# Patient Record
Sex: Female | Born: 1970 | Race: Black or African American | Hispanic: No | Marital: Married | Smoking: Never smoker
Health system: Southern US, Community
[De-identification: ages and names within clinical notes are randomized; demographics above are authoritative.]

## PROBLEM LIST (undated history)

## (undated) DIAGNOSIS — M545 Low back pain, unspecified: Secondary | ICD-10-CM

## (undated) DIAGNOSIS — G43909 Migraine, unspecified, not intractable, without status migrainosus: Secondary | ICD-10-CM

## (undated) DIAGNOSIS — I10 Essential (primary) hypertension: Secondary | ICD-10-CM

## (undated) DIAGNOSIS — R569 Unspecified convulsions: Secondary | ICD-10-CM

## (undated) DIAGNOSIS — Z789 Other specified health status: Secondary | ICD-10-CM

## (undated) DIAGNOSIS — F419 Anxiety disorder, unspecified: Secondary | ICD-10-CM

## (undated) HISTORY — DX: Essential (primary) hypertension: I10

## (undated) HISTORY — DX: Unspecified convulsions: R56.9

## (undated) HISTORY — DX: Low back pain, unspecified: M54.50

## (undated) HISTORY — DX: Other specified health status: Z78.9

## (undated) HISTORY — DX: Anxiety disorder, unspecified: F41.9

## (undated) HISTORY — PX: TUBAL LIGATION: SHX77

## (undated) HISTORY — DX: Migraine, unspecified, not intractable, without status migrainosus: G43.909

## (undated) HISTORY — DX: Low back pain: M54.5

---

## 1997-05-31 HISTORY — PX: BRAIN SURGERY: SHX531

## 2005-04-13 ENCOUNTER — Emergency Department: Payer: Self-pay | Admitting: Emergency Medicine

## 2005-05-16 ENCOUNTER — Emergency Department: Payer: Self-pay | Admitting: Emergency Medicine

## 2005-09-06 ENCOUNTER — Emergency Department: Payer: Self-pay | Admitting: Emergency Medicine

## 2006-09-06 ENCOUNTER — Emergency Department: Payer: Self-pay | Admitting: Emergency Medicine

## 2006-12-14 ENCOUNTER — Emergency Department: Payer: Self-pay | Admitting: Emergency Medicine

## 2007-03-15 ENCOUNTER — Emergency Department: Payer: Self-pay

## 2007-03-19 ENCOUNTER — Emergency Department: Payer: Self-pay | Admitting: Emergency Medicine

## 2007-09-02 ENCOUNTER — Other Ambulatory Visit: Payer: Self-pay

## 2007-09-02 ENCOUNTER — Inpatient Hospital Stay: Payer: Self-pay | Admitting: Internal Medicine

## 2007-09-25 ENCOUNTER — Encounter: Admission: RE | Admit: 2007-09-25 | Discharge: 2007-09-25 | Payer: Self-pay | Admitting: Neurosurgery

## 2007-09-29 ENCOUNTER — Encounter (INDEPENDENT_AMBULATORY_CARE_PROVIDER_SITE_OTHER): Payer: Self-pay | Admitting: Neurosurgery

## 2007-09-29 ENCOUNTER — Inpatient Hospital Stay (HOSPITAL_COMMUNITY): Admission: RE | Admit: 2007-09-29 | Discharge: 2007-10-01 | Payer: Self-pay | Admitting: Neurosurgery

## 2008-06-06 ENCOUNTER — Emergency Department: Payer: Self-pay | Admitting: Emergency Medicine

## 2008-06-11 ENCOUNTER — Observation Stay: Payer: Self-pay | Admitting: Internal Medicine

## 2008-11-05 ENCOUNTER — Emergency Department: Payer: Self-pay | Admitting: Emergency Medicine

## 2009-04-11 ENCOUNTER — Emergency Department: Payer: Self-pay | Admitting: Emergency Medicine

## 2009-06-04 ENCOUNTER — Emergency Department: Payer: Self-pay | Admitting: Emergency Medicine

## 2009-08-15 ENCOUNTER — Emergency Department: Payer: Self-pay | Admitting: Emergency Medicine

## 2009-08-18 ENCOUNTER — Emergency Department: Payer: Self-pay | Admitting: Emergency Medicine

## 2010-06-21 ENCOUNTER — Encounter: Payer: Self-pay | Admitting: Neurosurgery

## 2010-06-25 ENCOUNTER — Inpatient Hospital Stay: Payer: Self-pay | Admitting: Internal Medicine

## 2010-10-13 NOTE — Discharge Summary (Signed)
NAME:  Jaclyn Mcknight, Jaclyn Mcknight           ACCOUNT NO.:  [id number]   MEDICAL RECORD NO.:  [id number]          PATIENT TYPE:  INP   LOCATION:  3038                         FACILITY:  [facility]   PHYSICIAN:  Danae Orleans. Venetia Maxon, M.D.  DATE OF BIRTH:  06-23-1970   DATE OF ADMISSION:  09/29/2007  DATE OF DISCHARGE:  10/01/2007                               DISCHARGE SUMMARY   REASON FOR ADMISSION:  Examining right temperoparietal skull lesions.   FINAL DIAGNOSES:  Examining right temperoparietal skull lesions.   HISTORY OF PRESENT ILLNESS AND HOSPITAL COURSE:  Jaclyn Mcknight is a  40 year old woman with a lesion in the right temperoparietal skull.  She  has been complaining of headache.  A CT scan a year ago and now more  recently showed increasing size in the lesion.  Because of this, a  surgery is planned and the patient was admitted to hospital on the same-  day procedure basis and underwent resection of skull lesion with  methacrylate cranioplasty along with plates.  The patient tolerated the  procedure well and postoperatively did well but was having nausea and  vomiting on Sep 30, 2007, which resolved by Oct 01, 2007.  She was doing  better and was discharged home in stable surgical condition.   She was discharged on her preoperative medications of Ramipril,  Lopressor, aspirin, Xanax, and simvastatin with a prescription for  Percocet 5/325 one to two every 4 hours as needed for pain, dispensed  #50.  She is instructed to follow up with Dr. Jeral Fruit in his office.      Danae Orleans. Venetia Maxon, M.D.  Electronically Signed     JDS/MEDQ  D:  10/01/2007  T:  10/01/2007  Job:  [id number]

## 2010-10-13 NOTE — Op Note (Signed)
NAME:  Jaclyn Mcknight, Jaclyn Mcknight           ACCOUNT NO.:  [id number]   MEDICAL RECORD NO.:  [id number]          PATIENT TYPE:  INP   LOCATION:  3038                         FACILITY:  [facility]   PHYSICIAN:  Hilda Lias, M.D.   DATE OF BIRTH:  10/24/1970   DATE OF PROCEDURE:  DATE OF DISCHARGE:                               OPERATIVE REPORT   PREOPERATIVE DIAGNOSES:  Skull lesion in the right temporoparietal area,  expanding.   POSTOPERATIVE DIAGNOSIS:  Skull lesion in the right temporoparietal  area, expanding.   PROCEDURE:  Right temporoparietal craniectomy with resection of the bone  lesion.   SURGEON:  Hilda Lias, MD   ASSISTANT:  Danae Orleans. Venetia Maxon, MD   CLINICAL HISTORY:  Jaclyn Mcknight is a 40 year old female who has a lesion  in the right temporoparietal area.  She had been complaining of  headache.  She had a CT scan a year ago and now one recently, which  showed increase of the lesion.  Because of that, she wanted to proceed  with surgery secondary to the pain and of course because the lesion had  been expanding.  There has not been any other complaints.   PROCEDURE:  The patient was taken to the OR and the scalp was cleaned  with DuraPrep.  Prior to that, we had some skull x-rays with a needle in  place which showed the area where the lesion was.  From then on, drapes  were applied to the scalp and infiltration of the skin was done with  Xylocaine.  An italic S-type incision was made through the skin down to  the galea.  Resection was made laterally.  Immediately, it was apparent  the area where the lesion was.  We did a bur hole, and then with a  craniotome we went about 2 cm away from the edges of the lesion in a  simple fashion.  Once we accomplished the craniectomy, we were able to  see the edges left behind and the rest of the skull were normal.  The  area of the lesion were fairly dark.  The specimen was sent to the  laboratory.  We looked around just to be sure there  was no other lesion.  The edges were smoothed with a drill.  Then a tack-up suture with  Nurolon for dura matter to edge of the skull was found to prevent  epidural hematoma.  Then, methyl methacrylate was used to tailor a new  plate.  This plate was secured in place using 3 plates with six screws.  Irrigation was done and the hemostasis was accomplished, and the wound  was closed with Vicryl and Dermabond.           ______________________________  Hilda Lias, M.D.     EB/MEDQ  D:  09/29/2007  T:  09/30/2007  Job:  [id number]

## 2010-10-14 ENCOUNTER — Emergency Department: Payer: Self-pay | Admitting: Emergency Medicine

## 2010-11-19 ENCOUNTER — Emergency Department: Payer: Self-pay | Admitting: Emergency Medicine

## 2011-02-23 LAB — TYPE AND SCREEN
ABO/RH(D): B POS
Antibody Screen: NEGATIVE

## 2011-02-23 LAB — CBC
MCV: 92.8
RBC: 4.01
WBC: 7.3

## 2011-02-23 LAB — BASIC METABOLIC PANEL
BUN: 9
Creatinine, Ser: 0.74
GFR calc non Af Amer: >60
Glucose, Bld: 77
Potassium: 4

## 2011-02-23 LAB — ABO/RH: ABO/RH(D): B POS

## 2011-10-14 ENCOUNTER — Emergency Department: Payer: Self-pay | Admitting: Emergency Medicine

## 2012-04-06 LAB — HM PAP SMEAR: HM PAP: NEGATIVE

## 2012-04-14 ENCOUNTER — Emergency Department: Payer: Self-pay | Admitting: Emergency Medicine

## 2012-07-24 ENCOUNTER — Ambulatory Visit: Payer: Self-pay | Admitting: Physical Medicine and Rehabilitation

## 2012-10-10 ENCOUNTER — Emergency Department: Payer: Self-pay | Admitting: Emergency Medicine

## 2013-09-03 ENCOUNTER — Emergency Department: Payer: Self-pay | Admitting: Emergency Medicine

## 2013-11-29 ENCOUNTER — Emergency Department: Payer: Self-pay | Admitting: Emergency Medicine

## 2014-09-19 DIAGNOSIS — G43009 Migraine without aura, not intractable, without status migrainosus: Secondary | ICD-10-CM | POA: Insufficient documentation

## 2014-09-19 DIAGNOSIS — E162 Hypoglycemia, unspecified: Secondary | ICD-10-CM | POA: Insufficient documentation

## 2014-09-19 DIAGNOSIS — E782 Mixed hyperlipidemia: Secondary | ICD-10-CM | POA: Insufficient documentation

## 2014-09-19 DIAGNOSIS — I10 Essential (primary) hypertension: Secondary | ICD-10-CM | POA: Insufficient documentation

## 2014-09-19 DIAGNOSIS — R0789 Other chest pain: Secondary | ICD-10-CM | POA: Insufficient documentation

## 2014-09-19 DIAGNOSIS — G40909 Epilepsy, unspecified, not intractable, without status epilepticus: Secondary | ICD-10-CM | POA: Insufficient documentation

## 2014-09-26 ENCOUNTER — Emergency Department
Admit: 2014-09-26 | Disposition: A | Discharge status: Home / Self Care | Payer: Self-pay | Admitting: Emergency Medicine

## 2014-12-03 ENCOUNTER — Ambulatory Visit: Payer: Self-pay | Admitting: Family Medicine

## 2014-12-12 ENCOUNTER — Encounter: Payer: Self-pay | Admitting: Urgent Care

## 2014-12-12 ENCOUNTER — Emergency Department
Admission: EM | Admit: 2014-12-12 | Discharge: 2014-12-12 | Disposition: A | Discharge status: Home / Self Care | Payer: 59 | Attending: Emergency Medicine | Admitting: Emergency Medicine

## 2014-12-12 DIAGNOSIS — Z8669 Personal history of other diseases of the nervous system and sense organs: Secondary | ICD-10-CM | POA: Diagnosis not present

## 2014-12-12 DIAGNOSIS — Z79899 Other long term (current) drug therapy: Secondary | ICD-10-CM | POA: Diagnosis not present

## 2014-12-12 DIAGNOSIS — R519 Headache, unspecified: Secondary | ICD-10-CM

## 2014-12-12 DIAGNOSIS — R51 Headache: Secondary | ICD-10-CM | POA: Insufficient documentation

## 2014-12-12 DIAGNOSIS — I10 Essential (primary) hypertension: Secondary | ICD-10-CM | POA: Insufficient documentation

## 2014-12-12 MED ORDER — SODIUM CHLORIDE 0.9 % IV SOLN
1000.0000 mL | Freq: Once | INTRAVENOUS | Status: AC
  Administered 2014-12-12: 1000 mL via INTRAVENOUS

## 2014-12-12 MED ORDER — BUTALBITAL-APAP-CAFFEINE 50-325-40 MG PO TABS: 1.0000 | ORAL_TABLET | Freq: Four times a day (QID) | ORAL | Status: AC | PRN

## 2014-12-12 MED ORDER — KETOROLAC TROMETHAMINE 30 MG/ML IJ SOLN
30.0000 mg | Freq: Once | INTRAMUSCULAR | Status: AC
  Administered 2014-12-12: 30 mg via INTRAVENOUS
  Filled 2014-12-12: qty 1

## 2014-12-12 MED ORDER — DEXTROSE 5 % IV SOLN
20.0000 mg | Freq: Once | INTRAVENOUS | Status: DC
  Filled 2014-12-12: qty 4

## 2014-12-12 MED ORDER — DIPHENHYDRAMINE HCL 50 MG/ML IJ SOLN
25.0000 mg | Freq: Once | INTRAMUSCULAR | Status: AC
  Administered 2014-12-12: 25 mg via INTRAVENOUS
  Filled 2014-12-12: qty 1

## 2014-12-12 MED ORDER — SODIUM CHLORIDE 0.9 % IV SOLN
20.0000 mg | Freq: Once | INTRAVENOUS | Status: AC
  Administered 2014-12-12: 20 mg via INTRAVENOUS
  Filled 2014-12-12: qty 4

## 2014-12-12 NOTE — ED Notes (Signed)
Headache since yesterday.  No neuro deficits

## 2014-12-12 NOTE — Discharge Instructions (Signed)

## 2014-12-12 NOTE — ED Provider Notes (Signed)
[facility] Emergency Department Provider Note  ____________________________________________  Time seen: On arrival  I have reviewed the triage vital signs and the nursing notes.   HISTORY  Chief Complaint Headache   HPI Talyah Darron Doom Kliethermes is a 44 y.o. female who presents for headache. Patient reports she developed a global throbbing headache that began yesterday and became worse overnight. She notes it is an 8 currently. She has a history of similar headaches. She has no neck pain. No fevers. She has distant history of a cranioplasty secondary to a benign lesion on her skull. No neuro deficits. No visual changes. She takes Keppra for epilepsy     Past Medical History  Diagnosis Date  . Seizures   . Hypertension   . Back pain at L4-L5 level   . Migraines   . Anxiety   . Gravida 3 para 3     Patient Active Problem List   Diagnosis Date Noted  . Seizure 09/19/2014  . Essential (primary) hypertension 09/19/2014  . Migraine without aura and responsive to treatment 09/19/2014  . 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 Atypical chest pain 09/19/2014  . Hypoglycemia 09/19/2014    Past Surgical History  Procedure Laterality Date  . 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  . Tubal ligation      Current Outpatient Rx  Name  Route  Sig  Dispense  Refill  . levETIRAcetam (KEPPRA) 500 MG tablet   Oral   Take 1 tablet by mouth 2 (two) times daily.         Marland Kitchen. lisinopril-hydrochlorothiazide (PRINZIDE,ZESTORETIC) 20-12.5 MG per tablet   Oral   Take 1 tablet by mouth daily.           Allergies Review of patient's allergies indicates no known allergies.  Family History  Problem Relation Age of Onset  . Hypertension Mother   . Diabetes Mother   . Hypertension Sister   . Hypertension Maternal Aunt   . Diabetes Maternal Aunt   . Hypertension Maternal Grandmother   . Diabetes Maternal Grandmother     Social  History History  Substance Use Topics  . Smoking status: Never Smoker   . Smokeless tobacco: Never Used  . Alcohol Use: 0.6 oz/week    1 Glasses of wine per week     Comment: 2 glasses wine per month.    Review of Systems  Constitutional: Negative for fever. Eyes: Negative for visual changes. ENT: Negative for sore throat Cardiovascular: Negative for chest pain. Respiratory: Negative for shortness of breath. Gastrointestinal: Negative for abdominal pain, vomiting and diarrhea. Genitourinary: Negative for dysuria. Musculoskeletal: Negative for back pain. Skin: Negative for rash. Neurological: Negative for headaches or focal weakness Psychiatric:no anxiety  10-point ROS otherwise negative.  ____________________________________________   PHYSICAL EXAM:  VITAL SIGNS: ED Triage Vitals  Enc Vitals Group     BP 12/12/14 0704 131/85 mmHg     Pulse Rate 12/12/14 0704 63     Resp 12/12/14 0704 18     Temp 12/12/14 0704 98.1 F (36.7 C)     Temp Source 12/12/14 0704 Oral     SpO2 12/12/14 0704 100 %     Weight 12/12/14 0704 150 lb (68.04 kg)     Height 12/12/14 0704 5\' 6"  (1.676 m)     Head Cir --      Peak Flow --      Pain Score 12/12/14 0705 9     Pain  Loc --      Pain Edu? --      Excl. in GC? --      Constitutional: Alert and oriented. Well appearing and in no distress. Eyes: Conjunctivae are normal. PERRLA ENT   Head: Normocephalic and atraumatic.   Mouth/Throat: Mucous membranes are moist. Cardiovascular: Normal rate, regular rhythm. Normal and symmetric distal pulses are present in all extremities. No murmurs, rubs, or gallops. Respiratory: Normal respiratory effort without tachypnea nor retractions. Breath sounds are clear and equal bilaterally.  Gastrointestinal: Soft and non-tender in all quadrants. No distention. There is no CVA tenderness. Genitourinary: deferred Musculoskeletal: Nontender with normal range of motion in all extremities. No lower  extremity tenderness nor edema. Neurologic:  Normal speech and language. No gross focal neurologic deficits are appreciated. Skin:  Skin is warm, dry and intact. No rash noted. Psychiatric: Mood and affect are normal. Patient exhibits appropriate insight and judgment.  ____________________________________________    LABS (pertinent positives/negatives)  Labs Reviewed - No data to display  ____________________________________________   EKG  None  ____________________________________________    RADIOLOGY I have personally reviewed any xrays that were ordered on this patient: None  ____________________________________________   PROCEDURES  Procedure(s) performed: none  Critical Care performed: none  ____________________________________________   INITIAL IMPRESSION / ASSESSMENT AND PLAN / ED COURSE  Pertinent labs & imaging results that were available during my care of the patient were reviewed by me and considered in my medical decision making (see chart for details).  Benign exam. Neuro intact. No fevers, no meningismus. We will give Toradol IV, Reglan IV, Benadryl IV and normal saline and reevaluate. ----------------------------------------- 8:57 AM on 12/12/2014 -----------------------------------------  Headache has resolved. Patient resting quietly. Feels well, ready to go home. ____________________________________________   FINAL CLINICAL IMPRESSION(S) / ED DIAGNOSES  Final diagnoses:  Acute nonintractable headache, unspecified headache type     Jene Every, MD 12/12/14 [phone]

## 2015-01-13 ENCOUNTER — Emergency Department
Admission: EM | Admit: 2015-01-13 | Discharge: 2015-01-13 | Disposition: A | Discharge status: Home / Self Care | Payer: 59 | Attending: Emergency Medicine | Admitting: Emergency Medicine

## 2015-01-13 ENCOUNTER — Other Ambulatory Visit: Payer: Self-pay

## 2015-01-13 ENCOUNTER — Ambulatory Visit (INDEPENDENT_AMBULATORY_CARE_PROVIDER_SITE_OTHER): Payer: 59 | Admitting: Family Medicine

## 2015-01-13 ENCOUNTER — Encounter: Payer: Self-pay | Admitting: Family Medicine

## 2015-01-13 ENCOUNTER — Encounter: Payer: Self-pay | Admitting: *Deleted

## 2015-01-13 VITALS — BP 101/71 | HR 75 | Temp 98.5°F | Resp 16 | Ht 66.0 in | Wt 152.0 lb

## 2015-01-13 DIAGNOSIS — Z791 Long term (current) use of non-steroidal anti-inflammatories (NSAID): Secondary | ICD-10-CM | POA: Insufficient documentation

## 2015-01-13 DIAGNOSIS — R079 Chest pain, unspecified: Secondary | ICD-10-CM | POA: Diagnosis present

## 2015-01-13 DIAGNOSIS — R071 Chest pain on breathing: Secondary | ICD-10-CM | POA: Diagnosis not present

## 2015-01-13 DIAGNOSIS — D649 Anemia, unspecified: Secondary | ICD-10-CM | POA: Diagnosis not present

## 2015-01-13 DIAGNOSIS — E876 Hypokalemia: Secondary | ICD-10-CM | POA: Diagnosis not present

## 2015-01-13 DIAGNOSIS — I10 Essential (primary) hypertension: Secondary | ICD-10-CM | POA: Insufficient documentation

## 2015-01-13 DIAGNOSIS — A084 Viral intestinal infection, unspecified: Secondary | ICD-10-CM | POA: Diagnosis not present

## 2015-01-13 DIAGNOSIS — Z79899 Other long term (current) drug therapy: Secondary | ICD-10-CM | POA: Insufficient documentation

## 2015-01-13 DIAGNOSIS — Z79811 Long term (current) use of aromatase inhibitors: Secondary | ICD-10-CM | POA: Diagnosis not present

## 2015-01-13 DIAGNOSIS — G40909 Epilepsy, unspecified, not intractable, without status epilepticus: Secondary | ICD-10-CM

## 2015-01-13 DIAGNOSIS — R0789 Other chest pain: Secondary | ICD-10-CM

## 2015-01-13 LAB — CBC
HEMATOCRIT: 34.7 % — AB (ref 35.0–47.0)
HEMOGLOBIN: 11.4 g/dL — AB (ref 12.0–16.0)
MCH: 29.6 pg (ref 26.0–34.0)
MCHC: 32.8 g/dL (ref 32.0–36.0)
MCV: 90.2 fL (ref 80.0–100.0)
Platelets: 274 10*3/uL (ref 150–440)
RBC: 3.84 MIL/uL (ref 3.80–5.20)
RDW: 15.2 % — AB (ref 11.5–14.5)
WBC: 6 10*3/uL (ref 3.6–11.0)

## 2015-01-13 LAB — BASIC METABOLIC PANEL
ANION GAP: 8 (ref 5–15)
BUN: 11 mg/dL (ref 6–20)
CALCIUM: 8.6 mg/dL — AB (ref 8.9–10.3)
CO2: 25 mmol/L (ref 22–32)
Chloride: 105 mmol/L (ref 101–111)
Creatinine, Ser: 0.87 mg/dL (ref 0.44–1.00)
GFR calc Af Amer: >60 mL/min (ref >60)
GLUCOSE: 97 mg/dL (ref 65–99)
POTASSIUM: 3.4 mmol/L — AB (ref 3.5–5.1)
SODIUM: 138 mmol/L (ref 135–145)

## 2015-01-13 LAB — TROPONIN I

## 2015-01-13 MED ORDER — ONDANSETRON HCL 4 MG PO TABS: 4.0000 mg | ORAL_TABLET | Freq: Three times a day (TID) | ORAL | Status: DC | PRN

## 2015-01-13 MED ORDER — IBUPROFEN 600 MG PO TABS
600.0000 mg | ORAL_TABLET | Freq: Three times a day (TID) | ORAL | Status: DC
Start: 2015-01-13 — End: 2016-06-23

## 2015-01-13 MED ORDER — GI COCKTAIL ~~LOC~~
30.0000 mL | Freq: Once | ORAL | Status: AC
  Administered 2015-01-13: 30 mL via ORAL
  Filled 2015-01-13: qty 30

## 2015-01-13 MED ORDER — BISMUTH SUBSALICYLATE 262 MG PO CHEW: 524.0000 mg | CHEWABLE_TABLET | ORAL | Status: DC | PRN

## 2015-01-13 NOTE — ED Provider Notes (Signed)
[facility] Emergency Department Provider Note  ____________________________________________  Time seen: On arrival  I have reviewed the triage vital signs and the nursing notes.   HISTORY  Chief Complaint Chest Pain   HPI Jaclyn Mcknight is a 44 y.o. female who presents with complaints of chest pain. She reports the pain is burning and more in the epigastrium. She reports the pain started approximately 6 AM this morning. She notes it feels almost like indigestion. She denies shortness of breath. No recent travel. No calf pain, no lower show any swelling. No fevers no chills.No history of the same     Past Medical History  Diagnosis Date  . Seizures   . Hypertension   . Back pain at L4-L5 level   . Migraines   . Anxiety   . Gravida 3 para 3     Patient Active Problem List   Diagnosis Date Noted  . Seizure 09/19/2014  . Essential (primary) hypertension 09/19/2014  . Migraine without aura and responsive to treatment 09/19/2014  . 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 Atypical chest pain 09/19/2014  . Hypoglycemia 09/19/2014    Past Surgical History  Procedure Laterality Date  . 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  . Tubal ligation      Current Outpatient Rx  Name  Route  Sig  Dispense  Refill  . butalbital-acetaminophen-caffeine (FIORICET) 50-325-40 MG per tablet   Oral   Take 1-2 tablets by mouth every 6 (six) hours as needed for headache.   20 tablet   0   . ibuprofen (ADVIL,MOTRIN) 600 MG tablet   Oral   Take 600 mg by mouth as needed.         . levETIRAcetam (KEPPRA) 500 MG tablet   Oral   Take 1 tablet by mouth 2 (two) times daily.         Marland Kitchen lisinopril (PRINIVIL,ZESTRIL) 10 MG tablet   Oral   Take 10 mg by mouth daily.         Marland Kitchen lisinopril-hydrochlorothiazide (PRINZIDE,ZESTORETIC) 20-12.5 MG per tablet   Oral   Take 1 tablet by mouth daily.           Allergies Review of  patient's allergies indicates no known allergies.  Family History  Problem Relation Age of Onset  . Hypertension Mother   . Diabetes Mother   . Hypertension Sister   . Hypertension Maternal Aunt   . Diabetes Maternal Aunt   . Hypertension Maternal Grandmother   . Diabetes Maternal Grandmother     Social History Social History  Substance Use Topics  . Smoking status: Never Smoker   . Smokeless tobacco: Never Used  . Alcohol Use: 0.6 oz/week    1 Glasses of wine per week     Comment: 2 glasses wine per month.    Review of Systems  Constitutional: Negative for fever. Eyes: Negative for visual changes. ENT: Negative for sore throat Cardiovascular: Positive for chest pain Respiratory: Negative for shortness of breath. Gastrointestinal: Negative for abdominal pain, vomiting and diarrhea. Genitourinary: Negative for dysuria. Musculoskeletal: Negative for back pain. Skin: Negative for rash. Neurological: Negative for headaches or focal weakness    ____________________________________________   PHYSICAL EXAM:  VITAL SIGNS: ED Triage Vitals  Enc Vitals Group     BP 01/13/15 1053 129/83 mmHg     Pulse Rate 01/13/15 1053 72     Resp 01/13/15 1053 18     Temp 01/13/15  1053 98.9 F (37.2 C)     Temp Source 01/13/15 1053 Oral     SpO2 01/13/15 1053 100 %     Weight 01/13/15 1053 159 lb (72.122 kg)     Height 01/13/15 1053 5\' 6"  (1.676 m)     Head Cir --      Peak Flow --      Pain Score 01/13/15 1053 3     Pain Loc --      Pain Edu? --      Excl. in GC? --      Constitutional: Alert and oriented. Well appearing and in no distress. Eyes: Conjunctivae are normal.  ENT   Head: Normocephalic and atraumatic.   Mouth/Throat: Mucous membranes are moist. Cardiovascular: Normal rate, regular rhythm. Normal and symmetric distal pulses are present in all extremities. No murmurs, rubs, or gallops. Respiratory: Normal respiratory effort without tachypnea nor  retractions. Breath sounds are clear and equal bilaterally.  Gastrointestinal: Soft and non-tender in all quadrants. No distention. There is no CVA tenderness. Genitourinary: deferred Musculoskeletal: Nontender with normal range of motion in all extremities. No lower extremity tenderness nor edema. Neurologic:  Normal speech and language. No gross focal neurologic deficits are appreciated. Skin:  Skin is warm, dry and intact. No rash noted. Psychiatric: Mood and affect are normal. Patient exhibits appropriate insight and judgment.  ____________________________________________    LABS (pertinent positives/negatives)  Labs Reviewed  BASIC METABOLIC PANEL - Abnormal; Notable for the following:    Potassium 3.4 (*)    Calcium 8.6 (*)    All other components within normal limits  CBC - Abnormal; Notable for the following:    Hemoglobin 11.4 (*)    HCT 34.7 (*)    RDW 15.2 (*)    All other components within normal limits  TROPONIN I    ____________________________________________   EKG  ED ECG REPORT I, Jene Every, the attending physician, personally viewed and interpreted this ECG.  Date: 01/13/2015 EKG Time: 10:45 AM Rate: 78 Rhythm: normal sinus rhythm QRS Axis: normal Intervals: normal ST/T Wave abnormalities: normal Conduction Disutrbances: none Narrative Interpretation: unremarkable   ____________________________________________    RADIOLOGY I have personally reviewed any xrays that were ordered on this patient: None  ____________________________________________   PROCEDURES  Procedure(s) performed: none  Critical Care performed: none  ____________________________________________   INITIAL IMPRESSION / ASSESSMENT AND PLAN / ED COURSE  Pertinent labs & imaging results that were available during my care of the patient were reviewed by me and considered in my medical decision making (see chart for details).  Patient well-appearing and felt  significant better after GI cocktail. Her labs are benign. Given that her chest discomfort started greater than 6 hours ago her negative troponin and benign EKG are reassuring. I'll discharge the patient with close PCP follow-up. She knows to return if any change in her symptoms  ____________________________________________   FINAL CLINICAL IMPRESSION(S) / ED DIAGNOSES  Final diagnoses:  Chest pain, unspecified chest pain type     Jene Every, MD 01/13/15 [phone]

## 2015-01-13 NOTE — Progress Notes (Signed)
Subjective:    Patient ID: Jaclyn Mcknight, female    DOB: 09/25/1970, 44 y.o.   MRN: [id number]  HPI: Jaclyn Mcknight is a 44 y.o. female presenting on 01/13/2015 for Acute Visit   HPI  Pt presents with fatigue, maliase, chest tightness when she breaths, and nausea, vomitting, and diarrhea x 2 days. Symptoms started yesterday- first symptom was vomiting. She was seen in the ER today- given a GI cocktail in the ER that helped with the nausea. ER ruled out cardiac cause of chest discomfort and sent her home.  She was not sent home with any medication.  Chest tightness with deep breath- started today. Occurs occasionally.  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 Diarrhea is watery loose, no blood. No blood in vomit.  Past Medical History  Diagnosis Date  . Seizures   . Hypertension   . Back pain at L4-L5 level   . Migraines   . Anxiety   . Gravida 3 para 3     Current Outpatient Prescriptions on File Prior to Visit  Medication Sig  . butalbital-acetaminophen-caffeine (FIORICET) 50-325-40 MG per tablet Take 1-2 tablets by mouth every 6 (six) hours as needed for headache.  . levETIRAcetam (KEPPRA) 500 MG tablet Take 1 tablet by mouth 2 (two) times daily.  Marland Kitchen lisinopril-hydrochlorothiazide (PRINZIDE,ZESTORETIC) 20-12.5 MG per tablet Take 1 tablet by mouth daily.   No current facility-administered medications on file prior to visit.    Review of Systems  Constitutional: Positive for chills, appetite change and fatigue. Negative for fever.  HENT: Positive for rhinorrhea. Negative for congestion, postnasal drip, sinus pressure and trouble swallowing.   Respiratory: Positive for chest tightness (with deep breaths). Negative for shortness of breath and wheezing.   Cardiovascular: Negative for chest pain, palpitations and leg swelling.  Gastrointestinal: Positive for nausea, vomiting, abdominal pain and diarrhea. Negative for constipation.  Genitourinary: Negative  for dysuria, vaginal bleeding, vaginal discharge and difficulty urinating.  Musculoskeletal: Negative for neck pain and neck stiffness.  Neurological: Negative for dizziness, seizures, syncope and weakness.   Per HPI unless specifically indicated above     Objective:    BP 101/71 mmHg  Pulse 75  Temp(Src) 98.5 F (36.9 C) (Oral)  Resp 16  Ht 5\' 6"  (1.676 m)  Wt 152 lb (68.947 kg)  BMI 24.55 kg/m2  LMP 12/18/2014  Wt Readings from Last 3 Encounters:  01/13/15 152 lb (68.947 kg)  01/13/15 159 lb (72.122 kg)  12/12/14 150 lb (68.04 kg)    Physical Exam  Constitutional: She is oriented to person, place, and time. Vital signs are normal. She appears well-developed and well-nourished. She is cooperative. She appears ill. No distress.  HENT:  Head: Normocephalic and atraumatic.  Mouth/Throat: Oropharynx is clear and moist.  Neck: Neck supple.  Cardiovascular: Normal rate, regular rhythm, S1 normal, S2 normal and normal heart sounds.  Exam reveals no gallop and no friction rub.   No murmur heard. Pulmonary/Chest: Effort normal and breath sounds normal. She has no wheezes. Chest wall is not dull to percussion. She exhibits tenderness. She exhibits no mass.  Abdominal: Soft. Normal appearance and bowel sounds are normal. She exhibits no distension and no mass. There is no tenderness. There is no rebound and no guarding.  Musculoskeletal: Normal range of motion. She exhibits no edema or tenderness.  Lymphadenopathy:    She has no cervical adenopathy.  Neurological: She is alert and oriented to person, place, and time.  Skin:  Skin is warm and dry. She is not diaphoretic.  Normal skin turgor.    Results for orders placed or performed during the hospital encounter of 01/13/15  Basic metabolic panel  Result Value Ref Range   Sodium 138 135 - 145 mmol/L   Potassium 3.4 (L) 3.5 - 5.1 mmol/L   Chloride 105 101 - 111 mmol/L   CO2 25 22 - 32 mmol/L   Glucose, Bld 97 65 - 99 mg/dL   BUN 11  6 - 20 mg/dL   Creatinine, Ser 6.96 0.44 - 1.00 mg/dL   Calcium 8.6 (L) 8.9 - 10.3 mg/dL   GFR calc non Af Amer >60 >60 mL/min   GFR calc Af Amer >60 >60 mL/min   Anion gap 8 5 - 15  CBC  Result Value Ref Range   WBC 6.0 3.6 - 11.0 K/uL   RBC 3.84 3.80 - 5.20 MIL/uL   Hemoglobin 11.4 (L) 12.0 - 16.0 g/dL   HCT 29.5 (L) 28.4 - 13.2 %   MCV 90.2 80.0 - 100.0 fL   MCH 29.6 26.0 - 34.0 pg   MCHC 32.8 32.0 - 36.0 g/dL   RDW 44.0 (H) 10.2 - 72.5 %   Platelets 274 150 - 440 K/uL  Troponin I  Result Value Ref Range   Troponin I <0.03 <0.031 ng/mL      Assessment & Plan:   Problem List Items Addressed This Visit      Nervous and Auditory   Seizure disorder    Reviewed precautions with N/V and seizure medications. Pt advised to call neurology if she vomits up medication. If unable to keep meds down, she must go to ER.   Call EMS if seizures occur.        Other Visit Diagnoses    Viral gastroenteritis    -  Primary    Given sick contacts and presentation, likely viral. Supportive care. Alarm symptoms reviewed. Return precautions reviewed.     Relevant Medications    ondansetron (ZOFRAN) 4 MG tablet    bismuth subsalicylate (PEPTO-BISMOL) 262 MG chewable tablet    Costochondral pain        Patient is tender on chest wall. EKG in ER normal. Does lifting at work. Treat with NSAIDs.     Relevant Medications    ibuprofen (ADVIL,MOTRIN) 600 MG tablet    Anemia, unspecified anemia type        Incidental finding in ER. Iron studies ordered. Will treat as necessary. May explain pt fatigue.     Relevant Orders    Vitamin B12    Folate    Iron and TIBC    Ferritin    Hypokalemia        Likely due vomiting/diarrhea. Encouraged oral repletion. Recheck BMP in 1 week. Alarm signs reviewed.     Relevant Orders    Basic Metabolic Panel (BMET)       Meds ordered this encounter  Medications  . ondansetron (ZOFRAN) 4 MG tablet    Sig: Take 1 tablet (4 mg total) by mouth every 8 (eight)  hours as needed for nausea or vomiting.    Dispense:  20 tablet    Refill:  0    Order Specific Question:  Supervising Provider    Answer:  Janeann Forehand [phone]  . bismuth subsalicylate (PEPTO-BISMOL) 262 MG chewable tablet    Sig: Chew 2 tablets (524 mg total) by mouth as needed.    Dispense:  30 tablet  Refill:  0    Order Specific Question:  Supervising Provider    Answer:  Janeann Forehand [[id number]]  . ibuprofen (ADVIL,MOTRIN) 600 MG tablet    Sig: Take 1 tablet (600 mg total) by mouth 3 (three) times daily.    Dispense:  30 tablet    Refill:  0    Order Specific Question:  Supervising Provider    Answer:  Janeann Forehand [[id number]]      Follow up plan: Return if symptoms worsen or fail to improve.

## 2015-01-13 NOTE — Assessment & Plan Note (Signed)
Reviewed precautions with N/V and seizure medications. Pt advised to call neurology if she vomits up medication. If unable to keep meds down, she must go to ER.   Call EMS if seizures occur.

## 2015-01-13 NOTE — Patient Instructions (Addendum)
I think you have a stomach virus.  We will treat your nausea today. You can take zofran every 8 hours as needed for nausea. This medication can make you sleepy.  We will treat your symptoms with supportive care: Ensure you are drinking plenty of fluids- I recommend diluting gatorade 2oz to 6 oz water or drinking chicken broth for electroyltes and to boost your blood sugar. Start with bland meals when you feel up to it. Drinking is the most important- you do not need to eat if you don't feel well. Try peptobismal to help with your symptoms.  Alarm signs: If you noticed blood in your diarrhea, diarrhea continues for > 1.5-2 weeks without improving, shortness of breath, chest pain, or any concerning symptoms, please seek medical attention.  If you are vomit up your seizure medications- please call your neurologist to determine when you can redose them. Also if you are vomiting so much you are unable to keep down fluids or medications, please go to the ER.   Please get labs drawn in 1 week.

## 2015-01-13 NOTE — ED Notes (Signed)
Pt reports chest pain started today at work with nausea and an overall fatigue feeling

## 2015-01-13 NOTE — Discharge Instructions (Signed)

## 2015-03-03 ENCOUNTER — Emergency Department
Admission: EM | Admit: 2015-03-03 | Discharge: 2015-03-03 | Disposition: A | Discharge status: Home / Self Care | Payer: Commercial Managed Care - HMO | Attending: Emergency Medicine | Admitting: Emergency Medicine

## 2015-03-03 ENCOUNTER — Encounter: Payer: Self-pay | Admitting: Emergency Medicine

## 2015-03-03 DIAGNOSIS — I10 Essential (primary) hypertension: Secondary | ICD-10-CM | POA: Diagnosis not present

## 2015-03-03 DIAGNOSIS — Z79899 Other long term (current) drug therapy: Secondary | ICD-10-CM | POA: Diagnosis not present

## 2015-03-03 DIAGNOSIS — J018 Other acute sinusitis: Secondary | ICD-10-CM | POA: Insufficient documentation

## 2015-03-03 DIAGNOSIS — Z791 Long term (current) use of non-steroidal anti-inflammatories (NSAID): Secondary | ICD-10-CM | POA: Insufficient documentation

## 2015-03-03 DIAGNOSIS — B9689 Other specified bacterial agents as the cause of diseases classified elsewhere: Secondary | ICD-10-CM

## 2015-03-03 DIAGNOSIS — J029 Acute pharyngitis, unspecified: Secondary | ICD-10-CM | POA: Diagnosis present

## 2015-03-03 DIAGNOSIS — J019 Acute sinusitis, unspecified: Secondary | ICD-10-CM

## 2015-03-03 MED ORDER — CETIRIZINE HCL 10 MG PO TABS: 10.0000 mg | ORAL_TABLET | Freq: Every day | ORAL | Status: DC

## 2015-03-03 MED ORDER — FLUTICASONE PROPIONATE 50 MCG/ACT NA SUSP: 1.0000 | Freq: Two times a day (BID) | NASAL | Status: DC

## 2015-03-03 MED ORDER — AMOXICILLIN-POT CLAVULANATE 875-125 MG PO TABS: 1.0000 | ORAL_TABLET | Freq: Two times a day (BID) | ORAL | Status: DC

## 2015-03-03 NOTE — ED Provider Notes (Signed)
[facility] Emergency Department Provider Note  ____________________________________________  Time seen: Approximately 6:10 PM  I have reviewed the triage vital signs and the nursing notes.   HISTORY  Chief Complaint Sinusitis    HPI Jaclyn Mcknight is a 44 y.o. female resistance the emergency department complaining of a week history of sinus congestion, "ear popping", mild sore throat, and cough. She states that now there is a pressure sensation behind her eyes and in her forehead. She is sporadically taking allergy medicationswithout relief. She endorses an occasional chill.   Past Medical History  Diagnosis Date  . Seizures (HCC)   . Hypertension   . Back pain at L4-L5 level   . Migraines   . Anxiety   . Gravida 3 para 3     Patient Active Problem List   Diagnosis Date Noted  . Seizure disorder (HCC) 09/19/2014  . Essential (primary) hypertension 09/19/2014  . Migraine without aura and responsive to treatment 09/19/2014  . 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 Atypical chest pain 09/19/2014  . Hypoglycemia 09/19/2014    Past Surgical History  Procedure Laterality Date  . 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  . Tubal ligation      Current Outpatient Rx  Name  Route  Sig  Dispense  Refill  . amoxicillin-clavulanate (AUGMENTIN) 875-125 MG tablet   Oral   Take 1 tablet by mouth 2 (two) times daily.   14 tablet   0   . bismuth subsalicylate (PEPTO-BISMOL) 262 MG chewable tablet   Oral   Chew 2 tablets (524 mg total) by mouth as needed.   30 tablet   0   . butalbital-acetaminophen-caffeine (FIORICET) 50-325-40 MG per tablet   Oral   Take 1-2 tablets by mouth every 6 (six) hours as needed for headache.   20 tablet   0   . cetirizine (ZYRTEC) 10 MG tablet   Oral   Take 1 tablet (10 mg total) by mouth daily.   30 tablet   0   . fluticasone (FLONASE) 50 MCG/ACT nasal spray   Each Nare  Place 1 spray into both nostrils 2 (two) times daily.   16 g   0   . ibuprofen (ADVIL,MOTRIN) 600 MG tablet   Oral   Take 1 tablet (600 mg total) by mouth 3 (three) times daily.   30 tablet   0   . levETIRAcetam (KEPPRA) 500 MG tablet   Oral   Take 1 tablet by mouth 2 (two) times daily.         Marland Kitchen lisinopril-hydrochlorothiazide (PRINZIDE,ZESTORETIC) 20-12.5 MG per tablet   Oral   Take 1 tablet by mouth daily.         . ondansetron (ZOFRAN) 4 MG tablet   Oral   Take 1 tablet (4 mg total) by mouth every 8 (eight) hours as needed for nausea or vomiting.   20 tablet   0     Allergies Review of patient's allergies indicates no known allergies.  Family History  Problem Relation Age of Onset  . Hypertension Mother   . Diabetes Mother   . Hypertension Sister   . Hypertension Maternal Aunt   . Diabetes Maternal Aunt   . Hypertension Maternal Grandmother   . Diabetes Maternal Grandmother     Social History Social History  Substance Use Topics  . Smoking status: Never Smoker   . Smokeless tobacco: Never Used  . Alcohol Use: 0.6 oz/week  1 Glasses of wine per week     Comment: 2 glasses wine per month.    Review of Systems Constitutional: Mild fever/chills Eyes: No visual changes. ENT: Endorses sore throat. Endorses nasal congestion Cardiovascular: Denies chest pain. Respiratory: Denies shortness of breath. Endorses cough Gastrointestinal: No abdominal pain.  No nausea, no vomiting.  No diarrhea.  No constipation. Genitourinary: Negative for dysuria. Musculoskeletal: Negative for back pain. Skin: Negative for rash. Neurological: Negative for headaches, focal weakness or numbness.  10-point ROS otherwise negative.  ____________________________________________   PHYSICAL EXAM:  VITAL SIGNS: ED Triage Vitals  Enc Vitals Group     BP 03/03/15 1800 131/89 mmHg     Pulse Rate 03/03/15 1800 93     Resp 03/03/15 1800 18     Temp 03/03/15 1800 99.6 F (37.6  C)     Temp Source 03/03/15 1800 Oral     SpO2 03/03/15 1800 100 %     Weight --      Height --      Head Cir --      Peak Flow --      Pain Score --      Pain Loc --      Pain Edu? --      Excl. in GC? --     Constitutional: Alert and oriented. Well appearing and in no acute distress. Eyes: Conjunctivae are normal. PERRL. EOMI. Head: Atraumatic. Nose: Moderate purulent congestion/rhinnorhea. Tender to percussion over the frontal sinuses. Mouth/Throat: Mucous membranes are moist.  Oropharynx non-erythematous. Postnasal drip identified. Neck: No stridor.   Hematological/Lymphatic/Immunilogical: Diffuse, mobile, nontender anterior cervical lymphadenopathy. Cardiovascular: Normal rate, regular rhythm. Grossly normal heart sounds.  Good peripheral circulation. Respiratory: Normal respiratory effort.  No retractions. Lungs CTAB. Gastrointestinal: Soft and nontender. No distention. No abdominal bruits. No CVA tenderness. Musculoskeletal: No lower extremity tenderness nor edema.  No joint effusions. Neurologic:  Normal speech and language. No gross focal neurologic deficits are appreciated. No gait instability. Skin:  Skin is warm, dry and intact. No rash noted. Psychiatric: Mood and affect are normal. Speech and behavior are normal.  ____________________________________________   LABS (all labs ordered are listed, but only abnormal results are displayed)  Labs Reviewed - No data to display ____________________________________________  EKG   ____________________________________________  RADIOLOGY   ____________________________________________   PROCEDURES  Procedure(s) performed: None  Critical Care performed: No  ____________________________________________   INITIAL IMPRESSION / ASSESSMENT AND PLAN / ED COURSE  Pertinent labs & imaging results that were available during my care of the patient were reviewed by me and considered in my medical decision making (see  chart for details).  The patient's history, symptoms, and exam are most consistent with acute sinusitis. Advised the patient of diagnosis and she verbalizes understanding and agreement with same. Advised the patient that treatment plan would include Zyrtec, Flonase, Augmentin, Tylenol, ibuprofen, and probiotics. Patient verbalized understanding and compliance with treatment plan. ____________________________________________   FINAL CLINICAL IMPRESSION(S) / ED DIAGNOSES  Final diagnoses:  Acute bacterial sinusitis      Racheal Patches, PA-C 03/03/15 1854  Governor Rooks, MD 03/04/15 [phone]

## 2015-03-03 NOTE — Discharge Instructions (Signed)
Sinusitis °Sinusitis is redness, soreness, and inflammation of the paranasal sinuses. Paranasal sinuses are air pockets within the bones of your face (beneath the eyes, the middle of the forehead, or above the eyes). In healthy paranasal sinuses, mucus is able to drain out, and air is able to circulate through them by way of your nose. However, when your paranasal sinuses are inflamed, mucus and air can become trapped. This can allow bacteria and other germs to grow and cause infection. °Sinusitis can develop quickly and last only a short time (acute) or continue over a long period (chronic). Sinusitis that lasts for more than 12 weeks is considered chronic.  °CAUSES  °Causes of sinusitis include: °· Allergies. °· Structural abnormalities, such as displacement of the cartilage that separates your nostrils (deviated septum), which can decrease the air flow through your nose and sinuses and affect sinus drainage. °· Functional abnormalities, such as when the small hairs (cilia) that line your sinuses and help remove mucus do not work properly or are not present. °SIGNS AND SYMPTOMS  °Symptoms of acute and chronic sinusitis are the same. The primary symptoms are pain and pressure around the affected sinuses. Other symptoms include: °· Upper toothache. °· Earache. °· Headache. °· Bad breath. °· Decreased sense of smell and taste. °· A cough, which worsens when you are lying flat. °· Fatigue. °· Fever. °· Thick drainage from your nose, which often is green and may contain pus (purulent). °· Swelling and warmth over the affected sinuses. °DIAGNOSIS  °Your health care provider will perform a physical exam. During the exam, your health care provider may: °· Look in your nose for signs of abnormal growths in your nostrils (nasal polyps). °· Tap over the affected sinus to check for signs of infection. °· View the inside of your sinuses (endoscopy) using an imaging device that has a light attached (endoscope). °If your health  care provider suspects that you have chronic sinusitis, one or more of the following tests may be recommended: °· Allergy tests. °· Nasal culture. A sample of mucus is taken from your nose, sent to a lab, and screened for bacteria. °· Nasal cytology. A sample of mucus is taken from your nose and examined by your health care provider to determine if your sinusitis is related to an allergy. °TREATMENT  °Most cases of acute sinusitis are related to a viral infection and will resolve on their own within 10 days. Sometimes medicines are prescribed to help relieve symptoms (pain medicine, decongestants, nasal steroid sprays, or saline sprays).  °However, for sinusitis related to a bacterial infection, your health care provider will prescribe antibiotic medicines. These are medicines that will help kill the bacteria causing the infection.  °Rarely, sinusitis is caused by a fungal infection. In theses cases, your health care provider will prescribe antifungal medicine. °For some cases of chronic sinusitis, surgery is needed. Generally, these are cases in which sinusitis recurs more than 3 times per year, despite other treatments. °HOME CARE INSTRUCTIONS  °· Drink plenty of water. Water helps thin the mucus so your sinuses can drain more easily. °· Use a humidifier. °· Inhale steam 3 to 4 times a day (for example, sit in the bathroom with the shower running). °· Apply a warm, moist washcloth to your face 3 to 4 times a day, or as directed by your health care provider. °· Use saline nasal sprays to help moisten and clean your sinuses. °· Take medicines only as directed by your health care provider. °·   If you were prescribed either an antibiotic or antifungal medicine, finish it all even if you start to feel better. SEEK IMMEDIATE MEDICAL CARE IF:  You have increasing pain or severe headaches.  You have nausea, vomiting, or drowsiness.  You have swelling around your face.  You have vision problems.  You have a stiff  neck.  You have difficulty breathing. MAKE SURE YOU:   Understand these instructions.  Will watch your condition.  Will get help right away if you are not doing well or get worse. Document Released: 05/17/2005 Document Revised: 10/01/2013 Document Reviewed: 06/01/2011 [facility] Patient Information 2015 [place], [place]. This information is not intended to replace advice given to you by your health care provider. Make sure you discuss any questions you have with your health care provider.   Take medications as prescribed. He may take Tylenol and ibuprofen for additional symptom relief. Take a probiotic while you're on the antibiotics to prevent GI upset and near yeast infection. Continue the entire course of the antibiotics even if symptoms improve first.

## 2015-03-03 NOTE — ED Notes (Signed)
States she has been having sinus pressure for about 1 week . Pain behind eyes

## 2015-05-30 ENCOUNTER — Encounter: Payer: Self-pay | Admitting: Medical Oncology

## 2015-05-30 ENCOUNTER — Emergency Department
Admission: EM | Admit: 2015-05-30 | Discharge: 2015-05-30 | Disposition: A | Discharge status: Home / Self Care | Payer: Commercial Managed Care - HMO | Attending: Emergency Medicine | Admitting: Emergency Medicine

## 2015-05-30 ENCOUNTER — Emergency Department: Payer: Commercial Managed Care - HMO

## 2015-05-30 DIAGNOSIS — I10 Essential (primary) hypertension: Secondary | ICD-10-CM | POA: Diagnosis not present

## 2015-05-30 DIAGNOSIS — Z7951 Long term (current) use of inhaled steroids: Secondary | ICD-10-CM | POA: Insufficient documentation

## 2015-05-30 DIAGNOSIS — Z79899 Other long term (current) drug therapy: Secondary | ICD-10-CM | POA: Diagnosis not present

## 2015-05-30 DIAGNOSIS — R51 Headache: Secondary | ICD-10-CM | POA: Diagnosis present

## 2015-05-30 DIAGNOSIS — Z792 Long term (current) use of antibiotics: Secondary | ICD-10-CM | POA: Insufficient documentation

## 2015-05-30 DIAGNOSIS — G43109 Migraine with aura, not intractable, without status migrainosus: Secondary | ICD-10-CM | POA: Diagnosis not present

## 2015-05-30 LAB — CBC
HEMATOCRIT: 34.4 % — AB (ref 35.0–47.0)
Hemoglobin: 11.4 g/dL — ABNORMAL LOW (ref 12.0–16.0)
MCH: 30.4 pg (ref 26.0–34.0)
MCHC: 33.1 g/dL (ref 32.0–36.0)
MCV: 91.8 fL (ref 80.0–100.0)
PLATELETS: 328 10*3/uL (ref 150–440)
RBC: 3.75 MIL/uL — ABNORMAL LOW (ref 3.80–5.20)
RDW: 14.5 % (ref 11.5–14.5)
WBC: 6.2 10*3/uL (ref 3.6–11.0)

## 2015-05-30 LAB — COMPREHENSIVE METABOLIC PANEL
ALT: 11 U/L — ABNORMAL LOW (ref 14–54)
ANION GAP: 7 (ref 5–15)
AST: 24 U/L (ref 15–41)
Albumin: 4 g/dL (ref 3.5–5.0)
Alkaline Phosphatase: 78 U/L (ref 38–126)
BUN: 11 mg/dL (ref 6–20)
CHLORIDE: 105 mmol/L (ref 101–111)
CO2: 26 mmol/L (ref 22–32)
CREATININE: 0.8 mg/dL (ref 0.44–1.00)
Calcium: 8.9 mg/dL (ref 8.9–10.3)
Glucose, Bld: 88 mg/dL (ref 65–99)
POTASSIUM: 3.9 mmol/L (ref 3.5–5.1)
SODIUM: 138 mmol/L (ref 135–145)
Total Bilirubin: 0.7 mg/dL (ref 0.3–1.2)
Total Protein: 7.6 g/dL (ref 6.5–8.1)

## 2015-05-30 LAB — DIFFERENTIAL
BASOS PCT: 1 %
Basophils Absolute: 0 10*3/uL (ref 0–0.1)
EOS ABS: 0.3 10*3/uL (ref 0–0.7)
Eosinophils Relative: 4 %
Lymphocytes Relative: 34 %
Lymphs Abs: 2.1 10*3/uL (ref 1.0–3.6)
MONO ABS: 0.6 10*3/uL (ref 0.2–0.9)
MONOS PCT: 9 %
Neutro Abs: 3.3 10*3/uL (ref 1.4–6.5)
Neutrophils Relative %: 52 %

## 2015-05-30 LAB — TROPONIN I

## 2015-05-30 LAB — PROTIME-INR
INR: 0.94
PROTHROMBIN TIME: 12.8 s (ref 11.4–15.0)

## 2015-05-30 LAB — APTT: APTT: 26 s (ref 24–36)

## 2015-05-30 MED ORDER — KETOROLAC TROMETHAMINE 30 MG/ML IJ SOLN
30.0000 mg | Freq: Once | INTRAMUSCULAR | Status: AC
  Administered 2015-05-30: 30 mg via INTRAVENOUS
  Filled 2015-05-30: qty 1

## 2015-05-30 MED ORDER — METOCLOPRAMIDE HCL 5 MG/ML IJ SOLN
10.0000 mg | Freq: Once | INTRAMUSCULAR | Status: AC
  Administered 2015-05-30: 10 mg via INTRAVENOUS
  Filled 2015-05-30: qty 2

## 2015-05-30 MED ORDER — DIPHENHYDRAMINE HCL 50 MG/ML IJ SOLN
50.0000 mg | Freq: Once | INTRAMUSCULAR | Status: AC
  Administered 2015-05-30: 50 mg via INTRAVENOUS
  Filled 2015-05-30: qty 1

## 2015-05-30 MED ORDER — SODIUM CHLORIDE 0.9 % IV BOLUS (SEPSIS)
1000.0000 mL | Freq: Once | INTRAVENOUS | Status: AC
  Administered 2015-05-30: 1000 mL via INTRAVENOUS

## 2015-05-30 NOTE — ED Notes (Signed)
Pt reports HA x 2 hours, located left forehead radiating to posterior of head, described as pressure.  Husband reports pt had slurred speech for about 10 minutes.  Pt denies numbness or weakness.  Pt reports hx of migraines, and states this HA similar, but no slurred speech in past.  Pt NAD at this time, respirations equal and unlabored, skin warm and dry.

## 2015-05-30 NOTE — ED Notes (Signed)
Pt to triage with reports that she had a sudden onset of headache to left side and back of head with blurred vision, pt reports her husband picked her up from work and she began to talk, she knew what she wanted to say but couldn't get it out and speech was slurred. Pt continues to have headache, reports hx of migraine. NIH at this time is 0. Pt has no weakness noted and speech is clear.

## 2015-05-30 NOTE — Discharge Instructions (Signed)

## 2015-05-30 NOTE — ED Provider Notes (Signed)
[facility] Emergency Department Provider Note  Time seen: 7:51 PM  I have reviewed the triage vital signs and the nursing notes.   HISTORY  Chief Complaint Headache and Blurred Vision    HPI Jaclyn Mcknight is a 44 y.o. female with a past medical history of hypertension, migraines, seizure disorder, who presents the emergency department with a headache and slurred speech. According to the patient she has a history of migraine headaches. Today she states she developed a headache which is consistent with her typical migraine headache. Stated it started mild, she was experiencing visual disturbances experiencing pain when looking at light. Patient states this is typical for her migraines. Progressively the migraine worsen. The patient's significant other stated that her speech sounded slurred to him so they brought her to the department. She states the slurred speech has happened several times before typically when her blood pressure is very hyper patient. Denies any focal weakness or numbness. Denies confusion. No facial droop.     Past Medical History  Diagnosis Date  . Seizures (HCC)   . Hypertension   . Back pain at L4-L5 level   . Migraines   . Anxiety   . Gravida 3 para 3     Patient Active Problem List   Diagnosis Date Noted  . Seizure disorder (HCC) 09/19/2014  . Essential (primary) hypertension 09/19/2014  . Migraine without aura and responsive to treatment 09/19/2014  . 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 Atypical chest pain 09/19/2014  . Hypoglycemia 09/19/2014    Past Surgical History  Procedure Laterality Date  . 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  . Tubal ligation      Current Outpatient Rx  Name  Route  Sig  Dispense  Refill  . amoxicillin-clavulanate (AUGMENTIN) 875-125 MG tablet   Oral   Take 1 tablet by mouth 2 (two) times daily.   14 tablet   0   . bismuth subsalicylate  (PEPTO-BISMOL) 262 MG chewable tablet   Oral   Chew 2 tablets (524 mg total) by mouth as needed.   30 tablet   0   . butalbital-acetaminophen-caffeine (FIORICET) 50-325-40 MG per tablet   Oral   Take 1-2 tablets by mouth every 6 (six) hours as needed for headache.   20 tablet   0   . cetirizine (ZYRTEC) 10 MG tablet   Oral   Take 1 tablet (10 mg total) by mouth daily.   30 tablet   0   . fluticasone (FLONASE) 50 MCG/ACT nasal spray   Each Nare   Place 1 spray into both nostrils 2 (two) times daily.   16 g   0   . ibuprofen (ADVIL,MOTRIN) 600 MG tablet   Oral   Take 1 tablet (600 mg total) by mouth 3 (three) times daily.   30 tablet   0   . levETIRAcetam (KEPPRA) 500 MG tablet   Oral   Take 1 tablet by mouth 2 (two) times daily.         Marland Kitchen lisinopril-hydrochlorothiazide (PRINZIDE,ZESTORETIC) 20-12.5 MG per tablet   Oral   Take 1 tablet by mouth daily.         . ondansetron (ZOFRAN) 4 MG tablet   Oral   Take 1 tablet (4 mg total) by mouth every 8 (eight) hours as needed for nausea or vomiting.   20 tablet   0     Allergies Review of patient's allergies indicates no  known allergies.  Family History  Problem Relation Age of Onset  . Hypertension Mother   . Diabetes Mother   . Hypertension Sister   . Hypertension Maternal Aunt   . Diabetes Maternal Aunt   . Hypertension Maternal Grandmother   . Diabetes Maternal Grandmother     Social History Social History  Substance Use Topics  . Smoking status: Never Smoker   . Smokeless tobacco: Never Used  . Alcohol Use: 0.6 oz/week    1 Glasses of wine per week     Comment: 2 glasses wine per month.    Review of Systems Constitutional: Negative for fever. Cardiovascular: Negative for chest pain. Respiratory: Negative for shortness of breath. Gastrointestinal: Negative for abdominal pain Genitourinary: Negative for dysuria. Neurological: As it for moderate global headache. Denies focal weakness or  numbness.  10-point ROS otherwise negative.  ____________________________________________   PHYSICAL EXAM:  VITAL SIGNS: ED Triage Vitals  Enc Vitals Group     BP 05/30/15 1758 165/105 mmHg     Pulse Rate 05/30/15 1758 77     Resp 05/30/15 1758 18     Temp 05/30/15 1758 98.2 F (36.8 C)     Temp Source 05/30/15 1758 Oral     SpO2 05/30/15 1758 100 %     Weight 05/30/15 1758 150 lb (68.04 kg)     Height 05/30/15 1758 5\' 6"  (1.676 m)     Head Cir --      Peak Flow --      Pain Score 05/30/15 1759 8     Pain Loc --      Pain Edu? --      Excl. in GC? --     Constitutional: Alert and oriented. Well appearing and in no distress. Eyes: Normal exam ENT   Head: Normocephalic and atraumatic.   Mouth/Throat: Mucous membranes are moist. Cardiovascular: Normal rate, regular rhythm. No murmur Respiratory: Normal respiratory effort without tachypnea nor retractions. Breath sounds are clear and equal bilaterally. No wheezes/rales/rhonchi. Gastrointestinal: Soft and nontender. No distention.   Musculoskeletal: Nontender with normal range of motion in all extremities.  Neurologic:  Normal speech and language. No gross focal neurologic deficits. Equal grips rings bilaterally. No pronator drift. Pupils equal and reactive, cranial nerves intact Skin:  Skin is warm, dry and intact.  Psychiatric: Mood and affect are normal. Speech and behavior are normal.  ____________________________________________    EKG  EKG reviewed and interpreted by myself shows normal sinus rhythm at 66 bpm, narrow QRS, normal axis, normal intervals, no CT changes. Normal EKG.  ____________________________________________    RADIOLOGY  CT negative   INITIAL IMPRESSION / ASSESSMENT AND PLAN / ED COURSE  Pertinent labs & imaging results that were available during my care of the patient were reviewed by me and considered in my medical decision making (see chart for details).  Patient presents for  what appears to be a complex migraine. CT and labs are within normal limits. Patient has a normal neurologic exam currently. We will treat the patient's headache and closely monitor in the emergency department. No slurred speech or other neurologic deficit on my examination.  Patient states her headache is much improved at this time, very mild currently. We will discharge the patient home with primary care follow-up. Patient agreeable.  ____________________________________________   FINAL CLINICAL IMPRESSION(S) / ED DIAGNOSES  Complex migraine   Minna AntisKevin Mickey Esguerra, MD 05/30/15 2055

## 2015-09-06 ENCOUNTER — Other Ambulatory Visit: Payer: Self-pay | Admitting: Family Medicine

## 2015-09-08 ENCOUNTER — Other Ambulatory Visit: Payer: Self-pay | Admitting: Family Medicine

## 2015-11-13 ENCOUNTER — Emergency Department
Admission: EM | Admit: 2015-11-13 | Discharge: 2015-11-13 | Disposition: A | Discharge status: Home / Self Care | Payer: Commercial Managed Care - HMO | Attending: Student | Admitting: Student

## 2015-11-13 ENCOUNTER — Encounter: Payer: Self-pay | Admitting: *Deleted

## 2015-11-13 DIAGNOSIS — I10 Essential (primary) hypertension: Secondary | ICD-10-CM | POA: Diagnosis not present

## 2015-11-13 DIAGNOSIS — Z8669 Personal history of other diseases of the nervous system and sense organs: Secondary | ICD-10-CM | POA: Diagnosis not present

## 2015-11-13 DIAGNOSIS — Z79899 Other long term (current) drug therapy: Secondary | ICD-10-CM | POA: Insufficient documentation

## 2015-11-13 DIAGNOSIS — Z760 Encounter for issue of repeat prescription: Secondary | ICD-10-CM | POA: Diagnosis not present

## 2015-11-13 DIAGNOSIS — Z792 Long term (current) use of antibiotics: Secondary | ICD-10-CM | POA: Diagnosis not present

## 2015-11-13 DIAGNOSIS — G43009 Migraine without aura, not intractable, without status migrainosus: Secondary | ICD-10-CM | POA: Diagnosis present

## 2015-11-13 MED ORDER — PROCHLORPERAZINE EDISYLATE 5 MG/ML IJ SOLN
10.0000 mg | Freq: Once | INTRAMUSCULAR | Status: AC
  Administered 2015-11-13: 10 mg via INTRAMUSCULAR
  Filled 2015-11-13: qty 2

## 2015-11-13 MED ORDER — KETOROLAC TROMETHAMINE 30 MG/ML IJ SOLN
30.0000 mg | Freq: Once | INTRAMUSCULAR | Status: AC
  Administered 2015-11-13: 30 mg via INTRAMUSCULAR
  Filled 2015-11-13: qty 1

## 2015-11-13 MED ORDER — LISINOPRIL 10 MG PO TABS
20.0000 mg | ORAL_TABLET | Freq: Once | ORAL | Status: AC
  Administered 2015-11-13: 20 mg via ORAL
  Filled 2015-11-13: qty 2

## 2015-11-13 MED ORDER — DIPHENHYDRAMINE HCL 50 MG/ML IJ SOLN
50.0000 mg | Freq: Once | INTRAMUSCULAR | Status: AC
  Administered 2015-11-13: 50 mg via INTRAMUSCULAR
  Filled 2015-11-13: qty 1

## 2015-11-13 MED ORDER — LISINOPRIL-HYDROCHLOROTHIAZIDE 20-12.5 MG PO TABS: 1.0000 | ORAL_TABLET | Freq: Every day | ORAL | Status: DC

## 2015-11-13 MED ORDER — HYDROCHLOROTHIAZIDE 12.5 MG PO CAPS
12.5000 mg | ORAL_CAPSULE | Freq: Every day | ORAL | Status: DC
  Administered 2015-11-13: 12.5 mg via ORAL
  Filled 2015-11-13: qty 1

## 2015-11-13 NOTE — ED Notes (Signed)
Reviewed d/c instructions, follow-up care, and prescription with pt. Pt verbalized understanding 

## 2015-11-13 NOTE — Discharge Instructions (Signed)
Migraine Headache A migraine headache is an intense, throbbing pain on one or both sides of your head. A migraine can last for 30 minutes to several hours. CAUSES  The exact cause of a migraine headache is not always known. However, a migraine may be caused when nerves in the brain become irritated and release chemicals that cause inflammation. This causes pain. Certain things may also trigger migraines, such as:  Alcohol.  Smoking.  Stress.  Menstruation.  Aged cheeses.  Foods or drinks that contain nitrates, glutamate, aspartame, or tyramine.  Lack of sleep.  Chocolate.  Caffeine.  Hunger.  Physical exertion.  Fatigue.  Medicines used to treat chest pain (nitroglycerine), birth control pills, estrogen, and some blood pressure medicines. SIGNS AND SYMPTOMS  Pain on one or both sides of your head.  Pulsating or throbbing pain.  Severe pain that prevents daily activities.  Pain that is aggravated by any physical activity.  Nausea, vomiting, or both.  Dizziness.  Pain with exposure to bright lights, loud noises, or activity.  General sensitivity to bright lights, loud noises, or smells. Before you get a migraine, you may get warning signs that a migraine is coming (aura). An aura may include:  Seeing flashing lights.  Seeing bright spots, halos, or zigzag lines.  Having tunnel vision or blurred vision.  Having feelings of numbness or tingling.  Having trouble talking.  Having muscle weakness. DIAGNOSIS  A migraine headache is often diagnosed based on:  Symptoms.  Physical exam.  A CT scan or MRI of your head. These imaging tests cannot diagnose migraines, but they can help rule out other causes of headaches. TREATMENT Medicines may be given for pain and nausea. Medicines can also be given to help prevent recurrent migraines.  HOME CARE INSTRUCTIONS  Only take over-the-counter or prescription medicines for pain or discomfort as directed by your  health care provider. The use of long-term narcotics is not recommended.  Lie down in a dark, quiet room when you have a migraine.  Keep a journal to find out what may trigger your migraine headaches. For example, write down:  What you eat and drink.  How much sleep you get.  Any change to your diet or medicines.  Limit alcohol consumption.  Quit smoking if you smoke.  Get 7-9 hours of sleep, or as recommended by your health care provider.  Limit stress.  Keep lights dim if bright lights bother you and make your migraines worse. SEEK IMMEDIATE MEDICAL CARE IF:   Your migraine becomes severe.  You have a fever.  You have a stiff neck.  You have vision loss.  You have muscular weakness or loss of muscle control.  You start losing your balance or have trouble walking.  You feel faint or pass out.  You have severe symptoms that are different from your first symptoms. MAKE SURE YOU:   Understand these instructions.  Will watch your condition.  Will get help right away if you are not doing well or get worse.   This information is not intended to replace advice given to you by your health care provider. Make sure you discuss any questions you have with your health care provider.   Document Released: 05/17/2005 Document Revised: 06/07/2014 Document Reviewed: 01/22/2013 Elsevier Interactive Patient Education 2016 [employer].  Hypertension Hypertension, commonly called high blood pressure, is when the force of blood pumping through your arteries is too strong. Your arteries are the blood vessels that carry blood from your heart throughout your  body. A blood pressure reading consists of a higher number over a lower number, such as 110/72. The higher number (systolic) is the pressure inside your arteries when your heart pumps. The lower number (diastolic) is the pressure inside your arteries when your heart relaxes. Ideally you want your blood pressure below  120/80. Hypertension forces your heart to work harder to pump blood. Your arteries may become narrow or stiff. Having untreated or uncontrolled hypertension can cause heart attack, stroke, kidney disease, and other problems. RISK FACTORS Some risk factors for high blood pressure are controllable. Others are not.  Risk factors you cannot control include:   Race. You may be at higher risk if you are African American.  Age. Risk increases with age.  Gender. Men are at higher risk than women before age 26 years. After age 90, women are at higher risk than men. Risk factors you can control include:  Not getting enough exercise or physical activity.  Being overweight.  Getting too much fat, sugar, calories, or salt in your diet.  Drinking too much alcohol. SIGNS AND SYMPTOMS Hypertension does not usually cause signs or symptoms. Extremely high blood pressure (hypertensive crisis) may cause headache, anxiety, shortness of breath, and nosebleed. DIAGNOSIS To check if you have hypertension, your health care provider will measure your blood pressure while you are seated, with your arm held at the level of your heart. It should be measured at least twice using the same arm. Certain conditions can cause a difference in blood pressure between your right and left arms. A blood pressure reading that is higher than normal on one occasion does not mean that you need treatment. If it is not clear whether you have high blood pressure, you may be asked to return on a different day to have your blood pressure checked again. Or, you may be asked to monitor your blood pressure at home for 1 or more weeks. TREATMENT Treating high blood pressure includes making lifestyle changes and possibly taking medicine. Living a healthy lifestyle can help lower high blood pressure. You may need to change some of your habits. Lifestyle changes may include:  Following the DASH diet. This diet is high in fruits, vegetables, and  whole grains. It is low in salt, red meat, and added sugars.  Keep your sodium intake below 2,300 mg per day.  Getting at least 30-45 minutes of aerobic exercise at least 4 times per week.  Losing weight if necessary.  Not smoking.  Limiting alcoholic beverages.  Learning ways to reduce stress. Your health care provider may prescribe medicine if lifestyle changes are not enough to get your blood pressure under control, and if one of the following is true:  You are 20-22 years of age and your systolic blood pressure is above 140.  You are 73 years of age or older, and your systolic blood pressure is above 150.  Your diastolic blood pressure is above 90.  You have diabetes, and your systolic blood pressure is over 140 or your diastolic blood pressure is over 90.  You have kidney disease and your blood pressure is above 140/90.  You have heart disease and your blood pressure is above 140/90. Your personal target blood pressure may vary depending on your medical conditions, your age, and other factors. HOME CARE INSTRUCTIONS  Have your blood pressure rechecked as directed by your health care provider.   Take medicines only as directed by your health care provider. Follow the directions carefully. Blood pressure medicines must  be taken as prescribed. The medicine does not work as well when you skip doses. Skipping doses also puts you at risk for problems.  Do not smoke.   Monitor your blood pressure at home as directed by your health care provider. SEEK MEDICAL CARE IF:   You think you are having a reaction to medicines taken.  You have recurrent headaches or feel dizzy.  You have swelling in your ankles.  You have trouble with your vision. SEEK IMMEDIATE MEDICAL CARE IF:  You develop a severe headache or confusion.  You have unusual weakness, numbness, or feel faint.  You have severe chest or abdominal pain.  You vomit repeatedly.  You have trouble  breathing. MAKE SURE YOU:   Understand these instructions.  Will watch your condition.  Will get help right away if you are not doing well or get worse.   This information is not intended to replace advice given to you by your health care provider. Make sure you discuss any questions you have with your health care provider.   Document Released: 05/17/2005 Document Revised: 10/01/2014 Document Reviewed: 03/09/2013 Elsevier Interactive Patient Education [employer]2016 Elsevier Inc.

## 2015-11-13 NOTE — ED Notes (Addendum)
Pt reports a headache for 3 hours with nausea.  No otc meds today for pain.  Pt out of blood pressure meds for 2 weeks.  bp elevated tonight. Pt alert.  Speech clear.

## 2015-11-13 NOTE — ED Provider Notes (Signed)
[facility] Emergency Department Provider Note  ____________________________________________  Time seen: Approximately 8:16 PM  I have reviewed the triage vital signs and the nursing notes.   HISTORY  Chief Complaint Headache    HPI Jaclyn Mcknight is a 45 y.o. female who presents emergency department complaining of migraine headache 3 hours. Patient states that she has a history of same and that symptoms are consistent with previous headaches. Patient denies any new symptoms. It is described as a frontal headache. Patient endorses photophobia but denies phonophobia. Patient has not had any recent head trauma. Patient does have a history of high blood pressure and has run out of her medication this week. She has an appointment in 2 weeks to see primary care provider but is requesting medication refill at this time. No other symptoms or complaints. Patient denies visual changes, neck pain, chest pain, shortness of breath, nausea or vomiting, fever or chills.   Past Medical History  Diagnosis Date  . Seizures (HCC)   . Hypertension   . Back pain at L4-L5 level   . Migraines   . Anxiety   . Gravida 3 para 3     Patient Active Problem List   Diagnosis Date Noted  . Seizure disorder (HCC) 09/19/2014  . Essential (primary) hypertension 09/19/2014  . Migraine without aura and responsive to treatment 09/19/2014  . 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 Atypical chest pain 09/19/2014  . Hypoglycemia 09/19/2014    Past Surgical History  Procedure Laterality Date  . 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  . Tubal ligation      Current Outpatient Rx  Name  Route  Sig  Dispense  Refill  . amoxicillin-clavulanate (AUGMENTIN) 875-125 MG tablet   Oral   Take 1 tablet by mouth 2 (two) times daily.   14 tablet   0   . bismuth subsalicylate (PEPTO-BISMOL) 262 MG chewable tablet   Oral   Chew 2 tablets (524 mg total) by  mouth as needed.   30 tablet   0   . butalbital-acetaminophen-caffeine (FIORICET) 50-325-40 MG per tablet   Oral   Take 1-2 tablets by mouth every 6 (six) hours as needed for headache.   20 tablet   0   . cetirizine (ZYRTEC) 10 MG tablet   Oral   Take 1 tablet (10 mg total) by mouth daily.   30 tablet   0   . fluticasone (FLONASE) 50 MCG/ACT nasal spray   Each Nare   Place 1 spray into both nostrils 2 (two) times daily.   16 g   0   . ibuprofen (ADVIL,MOTRIN) 600 MG tablet   Oral   Take 1 tablet (600 mg total) by mouth 3 (three) times daily.   30 tablet   0   . levETIRAcetam (KEPPRA) 500 MG tablet   Oral   Take 1 tablet by mouth 2 (two) times daily.         Marland Kitchen lisinopril-hydrochlorothiazide (PRINZIDE,ZESTORETIC) 20-12.5 MG tablet      TAKE ONE TABLET BY MOUTH ONCE DAILY   30 tablet   2     Pt need appt for BP follow-up.   Marland Kitchen lisinopril-hydrochlorothiazide (ZESTORETIC) 20-12.5 MG tablet   Oral   Take 1 tablet by mouth daily.   30 tablet   0   . ondansetron (ZOFRAN) 4 MG tablet   Oral   Take 1 tablet (4 mg total) by mouth every 8 (eight)  hours as needed for nausea or vomiting.   20 tablet   0     Allergies Review of patient's allergies indicates no known allergies.  Family History  Problem Relation Age of Onset  . Hypertension Mother   . Diabetes Mother   . Hypertension Sister   . Hypertension Maternal Aunt   . Diabetes Maternal Aunt   . Hypertension Maternal Grandmother   . Diabetes Maternal Grandmother     Social History Social History  Substance Use Topics  . Smoking status: Never Smoker   . Smokeless tobacco: Never Used  . Alcohol Use: 0.6 oz/week    1 Glasses of wine per week     Comment: 2 glasses wine per month.     Review of Systems  Constitutional: No fever/chills Eyes: No visual changes.  Cardiovascular: no chest pain. Respiratory: no cough. No SOB. Gastrointestinal: No abdominal pain.  No nausea, no vomiting.    Musculoskeletal: Negative for musculoskeletal pain. Skin: Negative for rash, abrasions, lacerations, ecchymosis. Neurological: Positive for headache but denies focal weakness or numbness. 10-point ROS otherwise negative.  ____________________________________________   PHYSICAL EXAM:  VITAL SIGNS: ED Triage Vitals  Enc Vitals Group     BP 11/13/15 1917 177/118 mmHg     Pulse Rate 11/13/15 1917 80     Resp 11/13/15 1917 20     Temp 11/13/15 1917 98.7 F (37.1 C)     Temp Source 11/13/15 1917 Oral     SpO2 11/13/15 1917 99 %     Weight 11/13/15 1917 150 lb (68.04 kg)     Height 11/13/15 1917  (1.676 m)     Head Cir --      Peak Flow --      Pain Score 11/13/15 1918 10     Pain Loc --      Pain Edu? --      Excl. in GC? --      Constitutional: Alert and oriented. Well appearing and in no acute distress. Eyes: Conjunctivae are normal. PERRL. EOMI. Head: Atraumatic. Neck: No stridor. Neck is supple with full range of motion  Cardiovascular: Normal rate, regular rhythm. Normal S1 and S2.  Good peripheral circulation. Respiratory: Normal respiratory effort without tachypnea or retractions. Lungs CTAB. Good air entry to the bases with no decreased or absent breath sounds. Musculoskeletal: Full range of motion to all extremities. No gross deformities appreciated. Neurologic:  Normal speech and language. No gross focal neurologic deficits are appreciated. Cranial nerves II through XII grossly intact Skin:  Skin is warm, dry and intact. No rash noted. Psychiatric: Mood and affect are normal. Speech and behavior are normal. Patient exhibits appropriate insight and judgement.   ____________________________________________   LABS (all labs ordered are listed, but only abnormal results are displayed)  Labs Reviewed - No data to display ____________________________________________  EKG   ____________________________________________  RADIOLOGY   No results  found.  ____________________________________________    PROCEDURES  Procedure(s) performed:       Medications  lisinopril (PRINIVIL,ZESTRIL) tablet 20 mg (not administered)  hydrochlorothiazide (MICROZIDE) capsule 12.5 mg (not administered)  ketorolac (TORADOL) 30 MG/ML injection 30 mg (not administered)  prochlorperazine (COMPAZINE) injection 10 mg (not administered)  diphenhydrAMINE (BENADRYL) injection 50 mg (not administered)     ____________________________________________   INITIAL IMPRESSION / ASSESSMENT AND PLAN / ED COURSE  Pertinent labs & imaging results that were available during my care of the patient were reviewed by me and considered in my medical decision making (  see chart for details).  Patient's diagnosis is consistent with migraine headache and medication refill. Patient has a history of migraines and symptoms are consistent with previous headaches. Exam is reassuring. Patient is given migraine cocktail emergency department. Patient also ran out of her blood pressure medication and has an appointment in 2 weeks for primary care for medication refill. Blood pressure medication will be refilled at this time. She is given 1 dose of emergency department.. Patient will be discharged home with prescriptions for HTN medications. Patient is to follow up with primary care provider as needed or otherwise directed. Patient is given ED precautions to return to the ED for any worsening or new symptoms.     ____________________________________________  FINAL CLINICAL IMPRESSION(S) / ED DIAGNOSES  Final diagnoses:  Migraine without aura and responsive to treatment  Essential (primary) hypertension  Encounter for medication refill      NEW MEDICATIONS STARTED DURING THIS VISIT:  New Prescriptions   LISINOPRIL-HYDROCHLOROTHIAZIDE (ZESTORETIC) 20-12.5 MG TABLET    Take 1 tablet by mouth daily.        This chart was dictated using voice recognition  software/Dragon. Despite best efforts to proofread, errors can occur which can change the meaning. Any change was purely unintentional.    Racheal PatchesJonathan D Cuthriell, PA-C 11/13/15 2031  Gayla DossEryka A Gayle, MD 11/13/15 [phone]

## 2016-01-29 ENCOUNTER — Encounter: Payer: Self-pay | Admitting: Emergency Medicine

## 2016-01-29 ENCOUNTER — Emergency Department
Admission: EM | Admit: 2016-01-29 | Discharge: 2016-01-29 | Disposition: A | Discharge status: Home / Self Care | Payer: Commercial Managed Care - HMO | Attending: Emergency Medicine | Admitting: Emergency Medicine

## 2016-01-29 DIAGNOSIS — R0981 Nasal congestion: Secondary | ICD-10-CM

## 2016-01-29 DIAGNOSIS — H6993 Unspecified Eustachian tube disorder, bilateral: Secondary | ICD-10-CM | POA: Insufficient documentation

## 2016-01-29 DIAGNOSIS — Z79899 Other long term (current) drug therapy: Secondary | ICD-10-CM | POA: Insufficient documentation

## 2016-01-29 DIAGNOSIS — H68003 Unspecified Eustachian salpingitis, bilateral: Secondary | ICD-10-CM

## 2016-01-29 DIAGNOSIS — Z792 Long term (current) use of antibiotics: Secondary | ICD-10-CM | POA: Insufficient documentation

## 2016-01-29 DIAGNOSIS — Z791 Long term (current) use of non-steroidal anti-inflammatories (NSAID): Secondary | ICD-10-CM | POA: Insufficient documentation

## 2016-01-29 DIAGNOSIS — J029 Acute pharyngitis, unspecified: Secondary | ICD-10-CM

## 2016-01-29 DIAGNOSIS — I10 Essential (primary) hypertension: Secondary | ICD-10-CM | POA: Insufficient documentation

## 2016-01-29 MED ORDER — PROMETHAZINE-DM 6.25-15 MG/5ML PO SYRP: 5.0000 mL | ORAL_SOLUTION | Freq: Four times a day (QID) | ORAL | 0 refills | Status: DC | PRN

## 2016-01-29 MED ORDER — LIDOCAINE VISCOUS 2 % MT SOLN: 5.0000 mL | Freq: Four times a day (QID) | OROMUCOSAL | 0 refills | Status: DC | PRN

## 2016-01-29 NOTE — ED Notes (Signed)
Pt in via triage with complaints of nasal congestion, bilateral ear pain x 1 week.  Pt denies any cough.  Pt A/Ox4, no immediate distress noted.

## 2016-01-29 NOTE — ED Provider Notes (Signed)
[facility] Emergency Department Provider Note   ____________________________________________   First MD Initiated Contact with Patient 01/29/16 1411     (approximate)  I have reviewed the triage vital signs and the nursing notes.   HISTORY  Chief Complaint Otalgia and Sore Throat    HPI Jaclyn Mcknight is a 45 y.o. female patient complaining of 1 week of bilateral ear pressure and sore throat. Patient denies any fever at this complaint. Patient denies any cough. Patient stated there is some mild nausea but denies vomiting or diarrhea. No palliative measures taken for this complaint. Patient rates the pain as 8/10.   Past Medical History:  Diagnosis Date  . Anxiety   . Back pain at L4-L5 level   . Gravida 3 para 3   . Hypertension   . Migraines   . Seizures [facility])     Patient Active Problem List   Diagnosis Date Noted  . Seizure disorder (HCC) 09/19/2014  . Essential (primary) hypertension 09/19/2014  . Migraine without aura and responsive to treatment 09/19/2014  . 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 Atypical chest pain 09/19/2014  . Hypoglycemia 09/19/2014    Past Surgical History:  Procedure Laterality Date  . 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  . TUBAL LIGATION      Prior to Admission medications   Medication Sig Start Date End Date Taking? Authorizing Provider  amoxicillin-clavulanate (AUGMENTIN) 875-125 MG tablet Take 1 tablet by mouth 2 (two) times daily. 03/03/15   Delorise Royals Cuthriell, PA-C  bismuth subsalicylate (PEPTO-BISMOL) 262 MG chewable tablet Chew 2 tablets (524 mg total) by mouth as needed. 01/13/15   Amy Rusty Aus, NP  cetirizine (ZYRTEC) 10 MG tablet Take 1 tablet (10 mg total) by mouth daily. 03/03/15   Delorise Royals Cuthriell, PA-C  fluticasone (FLONASE) 50 MCG/ACT nasal spray Place 1 spray into both nostrils 2 (two) times daily. 03/03/15   Delorise Royals Cuthriell, PA-C  ibuprofen  (ADVIL,MOTRIN) 600 MG tablet Take 1 tablet (600 mg total) by mouth 3 (three) times daily. 01/13/15   Amy Rusty Aus, NP  levETIRAcetam (KEPPRA) 500 MG tablet Take 1 tablet by mouth 2 (two) times daily.    [employer]  lidocaine (XYLOCAINE) 2 % solution Use as directed 5 mLs in the mouth or throat every 6 (six) hours as needed for mouth pain. 01/29/16   Joni Reining, PA-C  lisinopril-hydrochlorothiazide (PRINZIDE,ZESTORETIC) 20-12.5 MG tablet TAKE ONE TABLET BY MOUTH ONCE DAILY 09/08/15   Amy Rusty Aus, NP  lisinopril-hydrochlorothiazide (ZESTORETIC) 20-12.5 MG tablet Take 1 tablet by mouth daily. 11/13/15 11/12/16  Christiane Ha D Cuthriell, PA-C  ondansetron (ZOFRAN) 4 MG tablet Take 1 tablet (4 mg total) by mouth every 8 (eight) hours as needed for nausea or vomiting. 01/13/15   Amy Rusty Aus, NP  promethazine-dextromethorphan (PROMETHAZINE-DM) 6.25-15 MG/5ML syrup Take 5 mLs by mouth 4 (four) times daily as needed for cough. 01/29/16   Joni Reining, PA-C    Allergies Review of patient's allergies indicates no known allergies.  Family History  Problem Relation Age of Onset  . Hypertension Mother   . Diabetes Mother   . Hypertension Sister   . Hypertension Maternal Aunt   . Diabetes Maternal Aunt   . Hypertension Maternal Grandmother   . Diabetes Maternal Grandmother     Social History Social History  Substance Use Topics  . Smoking status: Never Smoker  . Smokeless tobacco: Never Used  .  Alcohol use 0.6 oz/week    1 Glasses of wine per week     Comment: 2 glasses wine per month.    Review of Systems Constitutional: No fever/chills Eyes: No visual changes. ENT: No sore throat. Cardiovascular: Denies chest pain. Respiratory: Denies shortness of breath. Gastrointestinal: No abdominal pain.  No nausea, no vomiting.  No diarrhea.  No constipation. Genitourinary: Negative for dysuria. Musculoskeletal: Negative for back pain. Skin: Negative for rash. Neurological:  Negative for headaches, focal weakness or numbness. Psychiatric:Anxiety Endocrine:Hypertension .  ____________________________________________   PHYSICAL EXAM:  VITAL SIGNS: ED Triage Vitals  Enc Vitals Group     BP 01/29/16 1359 (!) 162/94     Pulse Rate 01/29/16 1359 75     Resp 01/29/16 1359 14     Temp 01/29/16 1359 98.3 F (36.8 C)     Temp Source 01/29/16 1359 Oral     SpO2 01/29/16 1359 100 %     Weight 01/29/16 1358 150 lb (68 kg)     Height 01/29/16 1358 5\' 6"  (1.676 m)     Head Circumference --      Peak Flow --      Pain Score 01/29/16 1359 7     Pain Loc --      Pain Edu? --      Excl. in GC? --     Constitutional: Alert and oriented. Well appearing and in no acute distress. Eyes: Conjunctivae are normal. PERRL. EOMI. Head: Atraumatic. Nose: Left maxillary guarding. Edematous nasal turbinates. Patient is bilateral bulging TMs. Mouth/Throat: Mucous membranes are moist.  Oropharynx non-erythematous. Postnasal drainage is visible. Neck: No stridor.  No cervical spine tenderness to palpation. Hematological/Lymphatic/Immunilogical: No cervical lymphadenopathy. Cardiovascular: Normal rate, regular rhythm. Grossly normal heart sounds.  Good peripheral circulation. Elevated blood pressure Respiratory: Normal respiratory effort.  No retractions. Lungs CTAB. Gastrointestinal: Soft and nontender. No distention. No abdominal bruits. No CVA tenderness. Musculoskeletal: No lower extremity tenderness nor edema.  No joint effusions. Neurologic:  Normal speech and language. No gross focal neurologic deficits are appreciated. No gait instability. Skin:  Skin is warm, dry and intact. No rash noted. Psychiatric: Mood and affect are normal. Speech and behavior are normal.  ____________________________________________   LABS (all labs ordered are listed, but only abnormal results are displayed)  Labs Reviewed - No data to  display ____________________________________________  EKG   ____________________________________________  RADIOLOGY   ____________________________________________   PROCEDURES  Procedure(s) performed:   Procedures  Critical Care performed: No  ____________________________________________   INITIAL IMPRESSION / ASSESSMENT AND PLAN / ED COURSE  Pertinent labs & imaging results that were available during my care of the patient were reviewed by me and considered in my medical decision making (see chart for details).  Sinus congestion and viral pharyngitis. Patient given discharge Instructions. Patient get a prescription for Bromfed-DM and viscous lidocaine to use as a swish and swallow.  Clinical Course     ____________________________________________   FINAL CLINICAL IMPRESSION(S) / ED DIAGNOSES  Final diagnoses:  Congestion of nasal sinus  Eustachian catarrh, bilateral  Pharyngitis      NEW MEDICATIONS STARTED DURING THIS VISIT:  New Prescriptions   LIDOCAINE (XYLOCAINE) 2 % SOLUTION    Use as directed 5 mLs in the mouth or throat every 6 (six) hours as needed for mouth pain.   PROMETHAZINE-DEXTROMETHORPHAN (PROMETHAZINE-DM) 6.25-15 MG/5ML SYRUP    Take 5 mLs by mouth 4 (four) times daily as needed for cough.     Note:  This  document was prepared using Conservation officer, [employer]Dragon voice recognition software and may include unintentional dictation errors.    Joni Reiningonald K Smith, PA-C 01/29/16 1425    Jeanmarie PlantJames A McShane, MD 01/29/16 [id number]

## 2016-01-29 NOTE — ED Triage Notes (Signed)
Pt c/o bilateral ear pain and sore throat X 1 wk.

## 2016-06-23 ENCOUNTER — Encounter: Payer: Self-pay | Admitting: *Deleted

## 2016-06-23 ENCOUNTER — Emergency Department
Admission: EM | Admit: 2016-06-23 | Discharge: 2016-06-23 | Disposition: A | Discharge status: Home / Self Care | Payer: Commercial Managed Care - HMO | Attending: Emergency Medicine | Admitting: Emergency Medicine

## 2016-06-23 DIAGNOSIS — Z79899 Other long term (current) drug therapy: Secondary | ICD-10-CM | POA: Insufficient documentation

## 2016-06-23 DIAGNOSIS — J111 Influenza due to unidentified influenza virus with other respiratory manifestations: Secondary | ICD-10-CM | POA: Insufficient documentation

## 2016-06-23 DIAGNOSIS — I10 Essential (primary) hypertension: Secondary | ICD-10-CM | POA: Insufficient documentation

## 2016-06-23 MED ORDER — ONDANSETRON 4 MG PO TBDP
4.0000 mg | ORAL_TABLET | Freq: Once | ORAL | Status: AC
  Administered 2016-06-23: 4 mg via ORAL
  Filled 2016-06-23: qty 1

## 2016-06-23 MED ORDER — FLUTICASONE PROPIONATE 50 MCG/ACT NA SUSP: 2.0000 | Freq: Every day | NASAL | 0 refills | Status: DC

## 2016-06-23 MED ORDER — OSELTAMIVIR PHOSPHATE 75 MG PO CAPS: 75.0000 mg | ORAL_CAPSULE | Freq: Two times a day (BID) | ORAL | 0 refills | Status: DC

## 2016-06-23 MED ORDER — PROMETHAZINE-DM 6.25-15 MG/5ML PO SYRP: 5.0000 mL | ORAL_SOLUTION | Freq: Four times a day (QID) | ORAL | 0 refills | Status: DC | PRN

## 2016-06-23 NOTE — ED Triage Notes (Signed)
States nasal congestion, cough and headache since last night

## 2016-06-23 NOTE — ED Notes (Signed)
Pt in NAD at time of discharge and verbalized understanding of treatment and home care of symptoms. Pt verbalized understanding of medications as well.

## 2016-06-23 NOTE — ED Notes (Signed)
See triage note   States she developed headache with some nausea and cough this am  Afebrile on arrival

## 2016-06-23 NOTE — ED Provider Notes (Signed)
[facility] Emergency Department Provider Note  ____________________________________________  Time seen: Approximately 11:02 AM  I have reviewed the triage vital signs and the nursing notes.   HISTORY  Chief Complaint Nasal Congestion and Cough    HPI Jaclyn Mcknight is a [age] y.o. female , NAD, presents to the emergency department with one-day history of flulike symptoms. 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 She states that the facility will soon be on "locked down" due to the illness spread. States that as of last night she had sudden onset headaches, body aches, cough and chest congestion. Has had some mild nausea but no vomiting or diarrhea. Denies any chest pain, shortness breath, wheezing. Has taken over-the-counter medications which has helped some of her body aches and nasal congestion.   Past Medical History:  Diagnosis Date  . Anxiety   . Back pain at L4-L5 level   . Gravida 3 para 3   . Hypertension   . Migraines   . Seizures [facility](HCC)     Patient Active Problem List   Diagnosis Date Noted  . Seizure disorder (HCC) 09/19/2014  . Essential (primary) hypertension 09/19/2014  . Migraine without aura and responsive to treatment 09/19/2014  . 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 Atypical chest pain 09/19/2014  . Hypoglycemia 09/19/2014    Past Surgical History:  Procedure Laterality Date  . 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  . TUBAL LIGATION      Prior to Admission medications   Medication Sig Start Date End Date Taking? Authorizing Provider  cetirizine (ZYRTEC) 10 MG tablet Take 1 tablet (10 mg total) by mouth daily. 03/03/15   Delorise RoyalsJonathan D Cuthriell, PA-C  fluticasone (FLONASE) 50 MCG/ACT nasal spray Place 2 sprays into both nostrils daily. 06/23/16   Amabel Stmarie L Giuseppe Duchemin, PA-C  levETIRAcetam (KEPPRA) 500 MG tablet Take 1 tablet by  mouth 2 (two) times daily.    [facility]  lisinopril-hydrochlorothiazide (PRINZIDE,ZESTORETIC) 20-12.5 MG tablet TAKE ONE TABLET BY MOUTH ONCE DAILY 09/08/15   Amy Rusty AusLauren Krebs, NP  lisinopril-hydrochlorothiazide (ZESTORETIC) 20-12.5 MG tablet Take 1 tablet by mouth daily. 11/13/15 11/12/16  Christiane HaJonathan D Cuthriell, PA-C  oseltamivir (TAMIFLU) 75 MG capsule Take 1 capsule (75 mg total) by mouth 2 (two) times daily. 06/23/16   Myeisha Kruser L Avalynn Bowe, PA-C  promethazine-dextromethorphan (PROMETHAZINE-DM) 6.25-15 MG/5ML syrup Take 5 mLs by mouth 4 (four) times daily as needed for cough. 06/23/16   Tawanna Funk L Torra Pala, PA-C    Allergies Patient has no known allergies.  Family History  Problem Relation Age of Onset  . Hypertension Mother   . Diabetes Mother   . Hypertension Sister   . Hypertension Maternal Aunt   . Diabetes Maternal Aunt   . Hypertension Maternal Grandmother   . Diabetes Maternal Grandmother     Social History Social History  Substance Use Topics  . Smoking status: Never Smoker  . Smokeless tobacco: Never Used  . Alcohol use 0.6 oz/week    1 Glasses of wine per week     Comment: 2 glasses wine per month.     Review of Systems  Constitutional: Positive fever, chills. ENT: Positive nasal congestion, sinus pressure, ear pressure, runny nose. No sore throat, ear pain. Cardiovascular: No chest pain. Respiratory: As of cough, chest congestion. No shortness of breath. No wheezing.  Gastrointestinal: Positive nausea without vomiting. No abdominal pain.  No  diarrhea.   Musculoskeletal: Positive for general myalgias.  Skin: Negative for rash. Neurological: Positive for headaches, but no focal weakness or numbness. 10-point ROS otherwise negative.  ____________________________________________   PHYSICAL EXAM:  VITAL SIGNS: ED Triage Vitals  Enc Vitals Group     BP 06/23/16 1026 (!) 171/99     Pulse Rate 06/23/16 1026 80     Resp 06/23/16 1026 18     Temp 06/23/16 1026 97.8  F (36.6 C)     Temp Source 06/23/16 1026 Oral     SpO2 06/23/16 1026 100 %     Weight 06/23/16 1025 157 lb (71.2 kg)     Height 06/23/16 1025 5\' 6"  (1.676 m)     Head Circumference --      Peak Flow --      Pain Score 06/23/16 1025 7     Pain Loc --      Pain Edu? --      Excl. in GC? --      Constitutional: Alert and oriented. Ill appearing but in no acute distress. Eyes: Conjunctivae are normal without icterus, injection or discharge. Head: Atraumatic. ENT:      Ears: TMs visualized bilaterally with mild injection, moderate bulging but no effusion or perforation.      Nose: Moderate congestion with clear rhinorrhea.      Mouth/Throat: Mucous membranes are moist. Pharynx with moderate injection but no swelling, exudate. Uvula is midline. Airway is patent. Clear postnasal drainage. Neck: No stridor. Supple with full range of motion. Hematological/Lymphatic/Immunilogical: No cervical lymphadenopathy. Cardiovascular: Normal rate, regular rhythm. Normal S1 and S2.  Good peripheral circulation. Respiratory: Normal respiratory effort without tachypnea or retractions. Lungs CTAB breath sounds noted in all lung fields. No wheeze, rhonchi, rales. Gastrointestinal: Soft and nontender without distention or guarding in all quadrants. No rebound or rigidity. No masses. Bowel sounds present normoactive in all quadrants. Musculoskeletal: No lower extremity tenderness nor edema.  No joint effusions. Neurologic:  Normal speech and language. No gross focal neurologic deficits are appreciated.  Skin:  Skin is warm, dry and intact. No rash noted. Psychiatric: Mood and affect are normal. Speech and behavior are normal. Patient exhibits appropriate insight and judgement.   ____________________________________________    LABS  None ____________________________________________  EKG  None ____________________________________________  RADIOLOGY  None ____________________________________________    PROCEDURES  Procedure(s) performed: None   Procedures   Medications  ondansetron (ZOFRAN-ODT) disintegrating tablet 4 mg (4 mg Oral Given 06/23/16 1144)     ____________________________________________   INITIAL IMPRESSION / ASSESSMENT AND PLAN / ED COURSE  Pertinent labs & imaging results that were available during my care of the patient were reviewed by me and considered in my medical decision making (see chart for details).     Patient's diagnosis is consistent with Influenza. Patient will be discharged home with prescriptions for Tamiflu, Flonase and promethazine-DM to take as directed. Patient is to follow up with her primary care provider if symptoms persist past this treatment course. Patient is given ED precautions to return to the ED for any worsening or new symptoms.    ____________________________________________  FINAL CLINICAL IMPRESSION(S) / ED DIAGNOSES  Final diagnoses:  Influenza      NEW MEDICATIONS STARTED DURING THIS VISIT:  Discharge Medication List as of 06/23/2016 11:25 AM    START taking these medications   Details  oseltamivir (TAMIFLU) 75 MG capsule Take 1 capsule (75 mg total) by mouth 2 (two) times daily., Starting Wed 06/23/2016, Print  Hope Pigeon, PA-C 06/23/16 1923    Governor Rooks, MD 07/08/16 1004

## 2016-07-14 ENCOUNTER — Encounter: Payer: Self-pay | Admitting: *Deleted

## 2016-07-14 DIAGNOSIS — Z0000 Encounter for general adult medical examination without abnormal findings: Secondary | ICD-10-CM | POA: Insufficient documentation

## 2016-07-14 DIAGNOSIS — Z1389 Encounter for screening for other disorder: Secondary | ICD-10-CM | POA: Insufficient documentation

## 2016-07-15 ENCOUNTER — Ambulatory Visit: Payer: 59 | Admitting: Family Medicine

## 2016-10-01 ENCOUNTER — Ambulatory Visit (INDEPENDENT_AMBULATORY_CARE_PROVIDER_SITE_OTHER): Payer: 59 | Admitting: Nurse Practitioner

## 2016-10-01 DIAGNOSIS — I10 Essential (primary) hypertension: Secondary | ICD-10-CM

## 2016-10-01 MED ORDER — LISINOPRIL-HYDROCHLOROTHIAZIDE 20-12.5 MG PO TABS: 1.0000 | ORAL_TABLET | Freq: Every day | ORAL | 0 refills | Status: DC

## 2016-10-01 NOTE — Progress Notes (Signed)
Subjective:    Patient ID: Jaclyn Mcknight, female    DOB: 1970-09-22, 46 y.o.   MRN: [id number]  Jaclyn Mcknight is a 46 y.o. female presenting on 10/01/2016 for Hypertension (Patient to follow-up on HTN last ov was in 2016. Patient reports that she is not checking her BP at home. Patient denies chest pain, shortness of breath or swelling around foot or legs. )  Husband Jaclyn Mcknight is present today.  HPI   Hypertension She has had high blood pressure for several years.  She had previously been taking Lisinopril - HCTZ Ran out "a while back"  Went to ED and refilled her rx and ran out and missed appt here, so she has not had medication.  She is currently having symptoms of headaches, irritability. These are her symptoms of high BP.  She has checked her BP at home and it is usually around 170/107. 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  Pt denies lightheadedness, dizziness, changes in vision, chest tightness/pressure, palpitations, sudden loss of speech or loss of consiousness.  She states she is motivated to take care of herself and keep coming to her PCP so she can stay on medication.  Social History  Substance Use Topics  . Smoking status: Never Smoker  . Smokeless tobacco: Never Used  . Alcohol use 0.6 oz/week    1 Glasses of wine per week     Comment: 2 glasses wine per month.    Review of Systems Per HPI unless specifically indicated above     Objective:    There were no vitals taken for this visit.  Wt Readings from Last 3 Encounters:  06/23/16 157 lb (71.2 kg)  01/29/16 150 lb (68 kg)  11/13/15 150 lb (68 kg)    Physical Exam  Constitutional: She is oriented to person, place, and time. She appears well-developed and well-nourished. No distress.  HENT:  Head: Normocephalic and atraumatic.  Eyes: Conjunctivae and EOM are normal. Pupils are equal, round, and reactive to light.    Fundoscopic exam:      The right eye shows no arteriolar narrowing, no AV nicking, no exudate, no hemorrhage and no papilledema. The right eye shows red reflex.       The left eye shows no arteriolar narrowing, no AV nicking, no exudate, no hemorrhage and no papilledema. The left eye shows red reflex.  Neck: Normal range of motion. Neck supple. No JVD present. No tracheal deviation present. No thyromegaly present.  No carotid bruits  Cardiovascular: Normal rate, regular rhythm, normal heart sounds and intact distal pulses.   Pulmonary/Chest: Effort normal and breath sounds normal.  Abdominal: Soft. Bowel sounds are normal. She exhibits no distension and no mass. There is no tenderness.  Lymphadenopathy:    She has no cervical adenopathy.  Neurological: She is alert and oriented to person, place, and time.  Skin: Skin is warm and dry.  Psychiatric: She has a normal mood and affect. Her behavior is normal. Judgment and thought content normal.   Results for orders placed or performed during the hospital encounter of 05/30/15  Protime-INR  Result Value Ref Range   Prothrombin Time 12.8 11.4 - 15.0 seconds   INR 0.94   APTT  Result Value Ref Range   aPTT 26 24 - 36 seconds  CBC  Result Value Ref Range   WBC 6.2 3.6 - 11.0 K/uL  RBC 3.75 (L) 3.80 - 5.20 MIL/uL   Hemoglobin 11.4 (L) 12.0 - 16.0 g/dL   HCT 16.134.4 (L) 09.635.0 - 04.547.0 %   MCV 91.8 80.0 - 100.0 fL   MCH 30.4 26.0 - 34.0 pg   MCHC 33.1 32.0 - 36.0 g/dL   RDW 40.914.5 81.111.5 - 91.414.5 %   Platelets 328 150 - 440 K/uL  Differential  Result Value Ref Range   Neutrophils Relative % 52 %   Neutro Abs 3.3 1.4 - 6.5 K/uL   Lymphocytes Relative 34 %   Lymphs Abs 2.1 1.0 - 3.6 K/uL   Monocytes Relative 9 %   Monocytes Absolute 0.6 0.2 - 0.9 K/uL   Eosinophils Relative 4 %   Eosinophils Absolute 0.3 0 - 0.7 K/uL   Basophils Relative 1 %   Basophils Absolute 0.0 0 - 0.1 K/uL  Comprehensive metabolic panel  Result Value Ref Range   Sodium  138 135 - 145 mmol/L   Potassium 3.9 3.5 - 5.1 mmol/L   Chloride 105 101 - 111 mmol/L   CO2 26 22 - 32 mmol/L   Glucose, Bld 88 65 - 99 mg/dL   BUN 11 6 - 20 mg/dL   Creatinine, Ser 7.820.80 0.44 - 1.00 mg/dL   Calcium 8.9 8.9 - 95.610.3 mg/dL   Total Protein 7.6 6.5 - 8.1 g/dL   Albumin 4.0 3.5 - 5.0 g/dL   AST 24 15 - 41 U/L   ALT 11 (L) 14 - 54 U/L   Alkaline Phosphatase 78 38 - 126 U/L   Total Bilirubin 0.7 0.3 - 1.2 mg/dL   GFR calc non Af Amer >60 >60 mL/min   GFR calc Af Amer >60 >60 mL/min   Anion gap 7 5 - 15  Troponin I  Result Value Ref Range   Troponin I <0.03 <0.031 ng/mL      Assessment & Plan:   Problem List Items Addressed This Visit      Cardiovascular and Mediastinum   Essential (primary) hypertension - Primary    Uncontrolled.  Not currently on medication.  Plan: - START (restart) lisinopril-HCTZ 20-12.5mg  tablet take one tablet once daily. - Check BP at home and bring log to appointment.   - Reviewed DASH diet. - Reviewed emergency signs and symptoms of hypertensive crisis and stroke.  Patient familiar with FAST acronym. - Check lipid panel screening for hyperlipidemia. - Check CMP for baseline kidney function prior to lisinopril and assessment of hypertensive nephropathy      Relevant Medications   lisinopril-hydrochlorothiazide (ZESTORETIC) 20-12.5 MG tablet   Other Relevant Orders   Comprehensive metabolic panel   Lipid panel      Meds ordered this encounter  Medications  . DISCONTD: lisinopril-hydrochlorothiazide (ZESTORETIC) 20-12.5 MG tablet    Sig: Take 1 tablet by mouth daily.    Dispense:  30 tablet    Refill:  0    Order Specific Question:   Supervising Provider    Answer:   Smitty CordsKARAMALEGOS, ALEXANDER J [[id number]]  . lisinopril-hydrochlorothiazide (ZESTORETIC) 20-12.5 MG tablet    Sig: Take 1 tablet by mouth daily.    Dispense:  30 tablet    Refill:  0      Follow up plan: Return in about 4 weeks (around 10/29/2016) for bp.    Wilhelmina McardleLauren  Ruthanne Mcneish, DNP, AGPCNP-BC Adult Gerontology Primary Care Nurse Practitioner [facility] 10/01/2016, 4:18 PM

## 2016-10-01 NOTE — Patient Instructions (Signed)
Jaclyn Mcknight, Thank you for coming in to clinic today.  For your high blood pressure: - Short term risks of high blood pressure that is untreated are stroke and hypertensive crisis.  - FAST acronym is used for signs of stroke Face drooping, arm weakness, speech difficulties, and time (move quickly to call 911)  - hypertensive crisis symptoms are sweating, lightheadedness, shortness of breath call 911  - START taking lisinopril 20-HCTZ 12.5 tablet once daily.  Goal BP is less than 130/90.  Also keep greater than 100/60 BP  - Try to incorporate one - two items per week that change your eating habits.  Make your overall eating habits more like the DASH diet/eating plan.   Please schedule a follow-up appointment with Wilhelmina Mcardle, AGNP in 4 weeks.  If you have any other questions or concerns, please feel free to call the clinic or send a message through MyChart. You may also schedule an earlier appointment if necessary.  Wilhelmina Mcardle, DNP, AGNP-BC Adult Gerontology Nurse Practitioner [facility]     DASH Eating Plan DASH stands for "Dietary Approaches to Stop Hypertension." The DASH eating plan is a healthy eating plan that has been shown to reduce high blood pressure (hypertension). It may also reduce your risk for type 2 diabetes, heart disease, and stroke. The DASH eating plan may also help with weight loss. What are tips for following this plan? General guidelines   Avoid eating more than 2,300 mg (milligrams) of salt (sodium) a day. If you have hypertension, you may need to reduce your sodium intake to 1,500 mg a day.  Limit alcohol intake to no more than 1 drink a day for nonpregnant women and 2 drinks a day for men. One drink equals 12 oz of beer, 5 oz of wine, or 1 oz of hard liquor.  Work with your health care provider to maintain a healthy body weight or to lose weight. Ask what an ideal weight is for you.  Get at least 30 minutes of exercise that  causes your heart to beat faster (aerobic exercise) most days of the week. Activities may include walking, swimming, or biking.  Work with your health care provider or diet and nutrition specialist (dietitian) to adjust your eating plan to your individual calorie needs. Reading food labels   Check food labels for the amount of sodium per serving. Choose foods with less than 5 percent of the Daily Value of sodium. Generally, foods with less than 300 mg of sodium per serving fit into this eating plan.  To find whole grains, look for the word "whole" as the first word in the ingredient list. Shopping   Buy products labeled as "low-sodium" or "no salt added."  Buy fresh foods. Avoid canned foods and premade or frozen meals. Cooking   Avoid adding salt when cooking. Use salt-free seasonings or herbs instead of table salt or sea salt. Check with your health care provider or pharmacist before using salt substitutes.  Do not fry foods. Cook foods using healthy methods such as baking, boiling, grilling, and broiling instead.  Cook with heart-healthy oils, such as olive, canola, soybean, or sunflower oil. Meal planning    Eat a balanced diet that includes:  5 or more servings of fruits and vegetables each day. At each meal, try to fill half of your plate with fruits and vegetables.  Up to 6-8 servings of whole grains each day.  Less than 6 oz of lean meat, poultry, or fish each  day. A 3-oz serving of meat is about the same size as a deck of cards. One egg equals 1 oz.  2 servings of low-fat dairy each day.  A serving of nuts, seeds, or beans 5 times each week.  Heart-healthy fats. Healthy fats called Omega-3 fatty acids are found in foods such as flaxseeds and coldwater fish, like sardines, salmon, and mackerel.  Limit how much you eat of the following:  Canned or prepackaged foods.  Food that is high in trans fat, such as fried foods.  Food that is high in saturated fat, such as  fatty meat.  Sweets, desserts, sugary drinks, and other foods with added sugar.  Full-fat dairy products.  Do not salt foods before eating.  Try to eat at least 2 vegetarian meals each week.  Eat more home-cooked food and less restaurant, buffet, and fast food.  When eating at a restaurant, ask that your food be prepared with less salt or no salt, if possible. What foods are recommended? The items listed may not be a complete list. Talk with your dietitian about what dietary choices are best for you. Grains  Whole-grain or whole-wheat bread. Whole-grain or whole-wheat pasta. Brown rice. Orpah Cobb. Bulgur. Whole-grain and low-sodium cereals. Pita bread. Low-fat, low-sodium crackers. Whole-wheat flour tortillas. Vegetables  Fresh or frozen vegetables (raw, steamed, roasted, or grilled). Low-sodium or reduced-sodium tomato and vegetable juice. Low-sodium or reduced-sodium tomato sauce and tomato paste. Low-sodium or reduced-sodium canned vegetables. Fruits  All fresh, dried, or frozen fruit. Canned fruit in natural juice (without added sugar). Meat and other protein foods  Skinless chicken or [place]. Ground chicken or [place]. Pork with fat trimmed off. Fish and seafood. Egg whites. Dried beans, peas, or lentils. Unsalted nuts, nut butters, and seeds. Unsalted canned beans. Lean cuts of beef with fat trimmed off. Low-sodium, lean deli meat. Dairy  Low-fat (1%) or fat-free (skim) milk. Fat-free, low-fat, or reduced-fat cheeses. Nonfat, low-sodium ricotta or cottage cheese. Low-fat or nonfat yogurt. Low-fat, low-sodium cheese. Fats and oils  Soft margarine without trans fats. Vegetable oil. Low-fat, reduced-fat, or light mayonnaise and salad dressings (reduced-sodium). Canola, safflower, olive, soybean, and sunflower oils. Avocado. Seasoning and other foods  Herbs. Spices. Seasoning mixes without salt. Unsalted popcorn and pretzels. Fat-free sweets. What foods are not recommended? The  items listed may not be a complete list. Talk with your dietitian about what dietary choices are best for you. Grains  Baked goods made with fat, such as croissants, muffins, or some breads. Dry pasta or rice meal packs. Vegetables  Creamed or fried vegetables. Vegetables in a cheese sauce. Regular canned vegetables (not low-sodium or reduced-sodium). Regular canned tomato sauce and paste (not low-sodium or reduced-sodium). Regular tomato and vegetable juice (not low-sodium or reduced-sodium). Rosita Fire. Olives. Fruits  Canned fruit in a light or heavy syrup. Fried fruit. Fruit in cream or butter sauce. Meat and other protein foods  Fatty cuts of meat. Ribs. Fried meat. Tomasa Blase. Sausage. Bologna and other processed lunch meats. Salami. Fatback. Hotdogs. Bratwurst. Salted nuts and seeds. Canned beans with added salt. Canned or smoked fish. Whole eggs or egg yolks. Chicken or [place] with skin. Dairy  Whole or 2% milk, cream, and half-and-half. Whole or full-fat cream cheese. Whole-fat or sweetened yogurt. Full-fat cheese. Nondairy creamers. Whipped toppings. Processed cheese and cheese spreads. Fats and oils  Butter. Stick margarine. Lard. Shortening. Ghee. Bacon fat. Tropical oils, such as coconut, palm kernel, or palm oil. Seasoning and other foods  Salted popcorn and  pretzels. Onion salt, garlic salt, seasoned salt, table salt, and sea salt. Worcestershire sauce. Tartar sauce. Barbecue sauce. Teriyaki sauce. Soy sauce, including reduced-sodium. Steak sauce. Canned and packaged gravies. Fish sauce. Oyster sauce. Cocktail sauce. Horseradish that you find on the shelf. Ketchup. Mustard. Meat flavorings and tenderizers. Bouillon cubes. Hot sauce and Tabasco sauce. Premade or packaged marinades. Premade or packaged taco seasonings. Relishes. Regular salad dressings. Where to find more information:  National Heart, Lung, and Blood Institute: PopSteam.iswww.nhlbi.nih.gov  American Heart Association:  www.heart.org Summary  The DASH eating plan is a healthy eating plan that has been shown to reduce high blood pressure (hypertension). It may also reduce your risk for type 2 diabetes, heart disease, and stroke.  With the DASH eating plan, you should limit salt (sodium) intake to 2,300 mg a day. If you have hypertension, you may need to reduce your sodium intake to 1,500 mg a day.  When on the DASH eating plan, aim to eat more fresh fruits and vegetables, whole grains, lean proteins, low-fat dairy, and heart-healthy fats.  Work with your health care provider or diet and nutrition specialist (dietitian) to adjust your eating plan to your individual calorie needs. This information is not intended to replace advice given to you by your health care provider. Make sure you discuss any questions you have with your health care provider. Document Released: 05/06/2011 Document Revised: 05/10/2016 Document Reviewed: 05/10/2016 Elsevier Interactive Patient Education  2017 [employer]

## 2016-10-02 ENCOUNTER — Encounter: Payer: Self-pay | Admitting: Nurse Practitioner

## 2016-10-02 NOTE — Assessment & Plan Note (Addendum)
Uncontrolled.  Not currently on medication.  Plan: - START (restart) lisinopril-HCTZ 20-12.5mg  tablet take one tablet once daily. - Check BP at home and bring log to appointment.   - Reviewed DASH diet. - Reviewed emergency signs and symptoms of hypertensive crisis and stroke.  Patient familiar with FAST acronym.

## 2016-10-03 NOTE — Progress Notes (Signed)
I have reviewed this encounter including the documentation in this note and/or discussed this patient with the provider, Wilhelmina McardleLauren Kennedy, AGPCNP-BC. I am certifying that I agree with the content of this note as supervising physician.  Saralyn PilarAlexander Karamalegos, DO [facility] 10/03/2016, 9:47 AM

## 2016-10-29 ENCOUNTER — Encounter: Payer: Self-pay | Admitting: Nurse Practitioner

## 2016-10-29 ENCOUNTER — Ambulatory Visit (INDEPENDENT_AMBULATORY_CARE_PROVIDER_SITE_OTHER): Payer: 59 | Admitting: Nurse Practitioner

## 2016-10-29 VITALS — BP 127/83 | HR 80 | Ht 66.0 in | Wt 152.0 lb

## 2016-10-29 DIAGNOSIS — R059 Cough, unspecified: Secondary | ICD-10-CM

## 2016-10-29 DIAGNOSIS — R05 Cough: Secondary | ICD-10-CM

## 2016-10-29 DIAGNOSIS — J069 Acute upper respiratory infection, unspecified: Secondary | ICD-10-CM | POA: Diagnosis not present

## 2016-10-29 DIAGNOSIS — I10 Essential (primary) hypertension: Secondary | ICD-10-CM | POA: Diagnosis not present

## 2016-10-29 MED ORDER — LISINOPRIL-HYDROCHLOROTHIAZIDE 20-12.5 MG PO TABS: 1.0000 | ORAL_TABLET | Freq: Every day | ORAL | 5 refills | Status: DC

## 2016-10-29 NOTE — Progress Notes (Signed)
Subjective:    Patient ID: Jaclyn Mcknight, female    DOB: 1971/01/24, 46 y.o.   MRN: [id number]  Jaclyn Mcknight is a 46 y.o. female presenting on 10/29/2016 for Hypertension (persistant cough, wheezing )   HPI   Hypertension Pt denies headache, lightheadedness, dizziness, changes in vision, chest tightness/pressure, palpitations, leg swelling, sudden loss of speech or loss of consciousness.  Current medications: lisinopril-HCTZ 20-12.5 mg tablet once daily, tolerating with side effects of possible new ACE-related cough.  Diet: Cut out fried food, baking at home.  Less butter (still butter).  More vegetables.  Low salt diet, lower fat diet.  Physical activity: walking only at work - does get hr up and breathing heavier.    Cough Congested, wheezing with shortness of breath.  No sinus pressure, pain headache,  Rhinorrhea, nasal congestion, itchy throat,  sometimes postnasal drip.  Is not currently taking any medications for this.  Symptom onset was 3 days ago.  Has been taking dayquil/nyquil.  Is not helping much.  Nonsmoker.  Social History  Substance Use Topics  . Smoking status: Never Smoker  . Smokeless tobacco: Never Used  . Alcohol use 0.6 oz/week    1 Glasses of wine per week     Comment: 2 glasses wine per month.    Review of Systems Per HPI unless specifically indicated above     Objective:    BP 127/83   Pulse 80   Ht 5\' 6"  (1.676 m)   Wt 152 lb (68.9 kg)   SpO2 100%   BMI 24.53 kg/m     Wt Readings from Last 3 Encounters:  10/29/16 152 lb (68.9 kg)  06/23/16 157 lb (71.2 kg)  01/29/16 150 lb (68 kg)    Physical Exam  Constitutional: She is oriented to person, place, and time. She appears well-developed and well-nourished. No distress.  HENT:  Head: Normocephalic and atraumatic.  Right Ear: Hearing, tympanic membrane, external ear and ear canal normal.  Left Ear: Hearing, tympanic membrane, external ear and ear canal normal.  Nose:  Mucosal edema and rhinorrhea present. Right sinus exhibits no maxillary sinus tenderness and no frontal sinus tenderness. Left sinus exhibits no maxillary sinus tenderness and no frontal sinus tenderness.  Mouth/Throat: Uvula is midline, oropharynx is clear and moist and mucous membranes are normal.  Eyes: Conjunctivae are normal. Pupils are equal, round, and reactive to light. Right eye exhibits no discharge. Left eye exhibits no discharge.  Neck: Normal range of motion. Neck supple. No JVD present.  Cardiovascular: Normal rate, regular rhythm, normal heart sounds and intact distal pulses.   Pulmonary/Chest: Effort normal and breath sounds normal. No respiratory distress.  Lymphadenopathy:    She has no cervical adenopathy.  Neurological: She is alert and oriented to person, place, and time.  Skin: Skin is warm and dry.  Psychiatric: She has a normal mood and affect. Her behavior is normal. Judgment and thought content normal.  Vitals reviewed.      Assessment & Plan:   Problem List Items Addressed This Visit      Cardiovascular and Mediastinum   Essential (primary) hypertension - Primary    Controlled with medication and diet changes. Currently on lisinopril HCTZ 20-12.5 mg once daily.  New cough x 3 days possible association to ACE.  Pt did not get initial labs for CMP, lipids.  Reviewed importance w/ starting ACE and ongoing followup.  Plan: - Continue lisinopril-HCTZ 20-12.5mg  tablet take one tablet once daily. - Check BP  at home and bring log to appointment.   - Reviewed DASH diet. - Pt making some changes already. - Encouraged to obtain CMP and lipid panel.  Discussed importance of obtaining.  Pt needs [facility] orders to facilitate lab draw in afternoon. - Follow up 6 months unless cough persists.      Relevant Medications   lisinopril-hydrochlorothiazide (ZESTORETIC) 20-12.5 MG tablet   Other Relevant Orders   Comprehensive metabolic panel   Lipid Profile    Other Visit  Diagnoses    Cough       Upper respiratory tract infection, unspecified type     Likely acute illness. Possible side effect of ace if no improvement in 7-14 days.  Afebrile. Symptoms not worsening. Consistent with viral illness x 3 days with no known sick contacts and no identifiable focal infections of ears, nose, throat.  Plan: 1. Reassurance, likely self-limited with cough lasting up to few weeks - Start anti-histamine Loratadine 10mg  daily,  - Start Mucinex or Mucinex-DM OTC up to 7-10 days then stop 2. Supportive care with nasal saline, warm herbal tea with honey, 3. Improve hydration 4. Tylenol / Motrin PRN fevers 5. Return criteria given.  If no improvement 7-14 days, symptoms of cough likely related to ACE. Notify clinic if no improvement.      Meds ordered this encounter  Medications  . lisinopril-hydrochlorothiazide (ZESTORETIC) 20-12.5 MG tablet    Sig: Take 1 tablet by mouth daily.    Dispense:  30 tablet    Refill:  5    Order Specific Question:   Supervising Provider    Answer:   Smitty CordsKARAMALEGOS, ALEXANDER J [[id number]]      Follow up plan: Return in about 6 months (around 04/30/2017) for blood pressure.   Wilhelmina McardleLauren Taiya Nutting, DNP, AGPCNP-BC Adult Gerontology Primary Care Nurse Practitioner [facility] 11/02/2016, 12:31 PM

## 2016-10-29 NOTE — Patient Instructions (Signed)
Delrae, Thank you for coming in to clinic today.  1. FOr your cough: - Take claritin 10 mg once daily for the next 2 weeks or longer if needed. - Can take guaifenesin (Mucinex) - loosens your secretions. - IT might be the lisinopril, so if it does not improve over the next 7-14 days we will know we need to change your medicine.  2. FOr your blood pressure: - Continue your lisinopril 20 -12.5 mg tablet once daily. - Try to check BP 1-3 x per week and keep a log.    Please schedule a follow-up appointment with Wilhelmina McardleLauren Shontel Santee, AGNP to Return in about 6 months (around 04/30/2017) for blood pressure.  If you have any other questions or concerns, please feel free to call the clinic or send a message through MyChart. You may also schedule an earlier appointment if necessary.  Wilhelmina McardleLauren Kyliana Standen, DNP, AGNP-BC Adult Gerontology Nurse Practitioner [facility]

## 2016-11-02 NOTE — Assessment & Plan Note (Addendum)
Controlled with medication and diet changes. Currently on lisinopril HCTZ 20-12.5 mg once daily.  New cough x 3 days possible association to ACE.  Plan: - Continue lisinopril-HCTZ 20-12.5mg  tablet take one tablet once daily. - Check BP at home and bring log to appointment.   - Reviewed DASH diet. - Pt making some changes already. - Encouraged to obtain CMP and lipid panel.  Discussed importance of obtaining.  Pt needs [facility] orders to facilitate lab draw in afternoon. - Follow up 6 months unless cough persists.

## 2016-11-02 NOTE — Progress Notes (Signed)
I have reviewed this encounter including the documentation in this note and/or discussed this patient with the provider, Wilhelmina McardleLauren Kennedy, AGPCNP-BC. I am certifying that I agree with the content of this note as supervising physician.  Saralyn PilarAlexander Karamalegos, DO [facility] 11/02/2016, 1:06 PM

## 2017-03-01 ENCOUNTER — Emergency Department
Admission: EM | Admit: 2017-03-01 | Discharge: 2017-03-01 | Disposition: A | Discharge status: Home / Self Care | Payer: 59 | Attending: Emergency Medicine | Admitting: Emergency Medicine

## 2017-03-01 ENCOUNTER — Encounter: Payer: Self-pay | Admitting: Medical Oncology

## 2017-03-01 DIAGNOSIS — R05 Cough: Secondary | ICD-10-CM | POA: Diagnosis not present

## 2017-03-01 DIAGNOSIS — Z79899 Other long term (current) drug therapy: Secondary | ICD-10-CM | POA: Diagnosis not present

## 2017-03-01 DIAGNOSIS — I10 Essential (primary) hypertension: Secondary | ICD-10-CM | POA: Diagnosis not present

## 2017-03-01 DIAGNOSIS — R059 Cough, unspecified: Secondary | ICD-10-CM

## 2017-03-01 DIAGNOSIS — J069 Acute upper respiratory infection, unspecified: Secondary | ICD-10-CM | POA: Diagnosis present

## 2017-03-01 DIAGNOSIS — J00 Acute nasopharyngitis [common cold]: Secondary | ICD-10-CM

## 2017-03-01 MED ORDER — AZITHROMYCIN 250 MG PO TABS: ORAL_TABLET | ORAL | 0 refills | Status: AC

## 2017-03-01 MED ORDER — GUAIFENESIN-CODEINE 100-10 MG/5ML PO SOLN: 5.0000 mL | Freq: Four times a day (QID) | ORAL | 0 refills | Status: DC | PRN

## 2017-03-01 NOTE — ED Triage Notes (Signed)
Pt reports sinus pressure with cough and congestion that began 2 days ago

## 2017-03-01 NOTE — ED Provider Notes (Signed)
[facility] Emergency Department Provider Note   ____________________________________________   I have reviewed the triage vital signs and the nursing notes.   HISTORY  Chief Complaint URI    HPI Jaclyn Mcknight is a 46 y.o. female presents to emergency department with cough, nasal congestion, rhinorrhea, sinus pressure and low grade fever that began 2 days ago. 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 Patient denies any other symptoms associated above. Patient denies fever, chills, headache, vision changes, chest pain, chest tightness, shortness of breath, abdominal pain, nausea and vomiting.  Past Medical History:  Diagnosis Date  . Anxiety   . Back pain at L4-L5 level   . Gravida 3 para 3   . Hypertension   . Migraines   . Seizures [facility])     Patient Active Problem List   Diagnosis Date Noted  . Encounter for general adult medical examination without abnormal findings 07/14/2016  . Screening for gout 07/14/2016  . Seizure disorder (HCC) 09/19/2014  . Essential (primary) hypertension 09/19/2014  . Migraine without aura and responsive to treatment 09/19/2014  . 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 Atypical chest pain 09/19/2014  . Hypoglycemia 09/19/2014    Past Surgical History:  Procedure Laterality Date  . 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  . TUBAL LIGATION      Prior to Admission medications   Medication Sig Start Date End Date Taking? Authorizing Provider  azithromycin (ZITHROMAX Z-PAK) 250 MG tablet Take 2 tablets (500 mg) on  Day 1,  followed by 1 tablet (250 mg) once daily on Days 2 through 5. 03/01/17 03/06/17  Jerrie Gullo M, PA-C  guaiFENesin-codeine 100-10 MG/5ML syrup Take 5 mLs by mouth every 6 (six) hours as needed for cough. 03/01/17   Micai Apolinar M, PA-C  lisinopril-hydrochlorothiazide (ZESTORETIC) 20-12.5 MG tablet Take 1 tablet by  mouth daily. 10/29/16 10/29/17  Galen Manila, NP    Allergies Patient has no known allergies.  Family History  Problem Relation Age of Onset  . Hypertension Mother   . Diabetes Mother   . Hypertension Sister   . Hypertension Maternal Aunt   . Diabetes Maternal Aunt   . Hypertension Maternal Grandmother   . Diabetes Maternal Grandmother     Social History Social History  Substance Use Topics  . Smoking status: Never Smoker  . Smokeless tobacco: Never Used  . Alcohol use 0.6 oz/week    1 Glasses of wine per week     Comment: 2 glasses wine per month.    Review of Systems Constitutional: positive for low-grade fever. Eyes: No visual changes. ENT:  Positive for sore throat and negative for difficult swallowing. Cardiovascular: Denies chest pain. Respiratory: positive for productive cough with scant yellow sputum. Denies shortness of breath. Gastrointestinal: No abdominal pain.  No nausea, vomiting, diarrhea Skin: Negative for rash. Neurological: Negative for headaches.   ____________________________________________   PHYSICAL EXAM:  VITAL SIGNS: ED Triage Vitals  Enc Vitals Group     BP 03/01/17 0905 (!) 161/109     Pulse Rate 03/01/17 0905 91     Resp 03/01/17 0905 18     Temp 03/01/17 0905 99.4 F (37.4 C)     Temp Source 03/01/17 0905 Oral     SpO2 03/01/17 0905 100 %     Weight 03/01/17 0905 154 lb (69.9 kg)     Height 03/01/17 0905   (1.676 m)     Head Circumference --      Peak Flow --      Pain Score 03/01/17 0904 7     Pain Loc --      Pain Edu? --      Excl. in GC? --     Constitutional: Alert and oriented. Well appearing and in no acute distress.  Eyes: Conjunctivae are normal. PERRL. EOMI  Head: Normocephalic and atraumatic. ENT:      Ears: Canals clear. TMs intact bilaterally.      Nose: congestion and rhinorrhea      Mouth/Throat: Mucous membranes are moist. Oropharynx erythematous without edema. Tonsils symmetrical  bilaterally. Neck:Supple. No thyromegaly. No stridor.  Cardiovascular: Normal rate, regular rhythm.   Good peripheral circulation. Respiratory: Normal respiratory effort without tachypnea or retractions. Lungs CTAB. No wheezes/rales/rhonchi. Good air entry to the bases with no decreased or absent breath sounds. Hematological/Lymphatic/Immunological: No cervical lymphadenopathy. Cardiovascular: Normal rate, regular rhythm. Normal distal pulses. Gastrointestinal: Bowel sounds 4 quadrants. Soft and nontender to palpation. Neurologic: Normal speech and language.  Skin:  Skin is warm, dry and intact. No rash noted. Psychiatric: Mood and affect are normal. Speech and behavior are normal. Patient exhibits appropriate insight and judgement.  ____________________________________________   LABS (all labs ordered are listed, but only abnormal results are displayed)  Labs Reviewed - No data to display ____________________________________________  EKG none ____________________________________________  RADIOLOGY none ____________________________________________   PROCEDURES  Procedure(s) performed: no   Critical Care performed: no ____________________________________________   INITIAL IMPRESSION / ASSESSMENT AND PLAN / ED COURSE  Pertinent labs & imaging results that were available during my care of the patient were reviewed by me and considered in my medical decision making (see chart for details).  Patient presents to the emergency department sore throat, congestion, sinus pressure, and low grade fever. History and physical exam are reassuring symptoms are consistent with nasopharyngitis and cough. Patient will be prescribed azithromycinfor antibiotic coverage and guaifenesin-codeine for cough as needed. Recommend patient to utilize OTC cold and sinus for symptom management as needed. Physical exam was reassuring during reassessment. At time of discharge, blood pressure was elevated.  Patient informed nursing that she had not taken her hypertension medication this morning. Patient reported she had the medication with her and was able to take it. Patient took her home hypertension medication and was discharged with her appropriate discharge paperwork. Patient informed of clinical course, understand medical decision-making process, and agree with plan. __   FINAL CLINICAL IMPRESSION(S) / ED DIAGNOSES  Final diagnoses:  Acute nasopharyngitis  Cough       NEW MEDICATIONS STARTED DURING THIS VISIT:  Discharge Medication List as of 03/01/2017 10:56 AM    START taking these medications   Details  azithromycin (ZITHROMAX Z-PAK) 250 MG tablet Take 2 tablets (500 mg) on  Day 1,  followed by 1 tablet (250 mg) once daily on Days 2 through 5., Print    guaiFENesin-codeine 100-10 MG/5ML syrup Take 5 mLs by mouth every 6 (six) hours as needed for cough., Starting Tue 03/01/2017, Print         Note:  This document was prepared using Dragon voice recognition software and may include unintentional dictation errors.    Clois Comber, PA-C 03/01/17 1606    Felicha Frayne, Jordan Likes, PA-C 03/01/17 1606    Jene Every, MD 03/04/17 [phone]

## 2017-03-01 NOTE — ED Notes (Signed)
Provider made aware of HTN, pt taking home dose of lisinopril at this time. Pt verbalizes d/c teaching and RX. Pt in NAD at time of d/c. Pt ambulatory to lobby

## 2017-07-11 ENCOUNTER — Other Ambulatory Visit: Payer: Self-pay | Admitting: Nurse Practitioner

## 2017-07-11 DIAGNOSIS — I10 Essential (primary) hypertension: Secondary | ICD-10-CM

## 2017-10-09 ENCOUNTER — Encounter: Payer: Self-pay | Admitting: Emergency Medicine

## 2017-10-09 ENCOUNTER — Emergency Department
Admission: EM | Admit: 2017-10-09 | Discharge: 2017-10-09 | Disposition: A | Discharge status: Home / Self Care | Payer: PRIVATE HEALTH INSURANCE | Attending: Emergency Medicine | Admitting: Emergency Medicine

## 2017-10-09 ENCOUNTER — Other Ambulatory Visit: Payer: Self-pay

## 2017-10-09 DIAGNOSIS — I10 Essential (primary) hypertension: Secondary | ICD-10-CM

## 2017-10-09 DIAGNOSIS — Z79899 Other long term (current) drug therapy: Secondary | ICD-10-CM | POA: Insufficient documentation

## 2017-10-09 DIAGNOSIS — G43101 Migraine with aura, not intractable, with status migrainosus: Secondary | ICD-10-CM | POA: Insufficient documentation

## 2017-10-09 LAB — POCT PREGNANCY, URINE: Preg Test, Ur: NEGATIVE

## 2017-10-09 MED ORDER — HYDROCHLOROTHIAZIDE 12.5 MG PO TABS: 12.5000 mg | ORAL_TABLET | Freq: Every day | ORAL | 1 refills | Status: DC

## 2017-10-09 MED ORDER — LISINOPRIL 10 MG PO TABS
20.0000 mg | ORAL_TABLET | Freq: Once | ORAL | Status: AC
  Administered 2017-10-09: 20 mg via ORAL
  Filled 2017-10-09: qty 2

## 2017-10-09 MED ORDER — LISINOPRIL 10 MG PO TABS: 20.0000 mg | ORAL_TABLET | Freq: Every day | ORAL | 1 refills | Status: DC

## 2017-10-09 MED ORDER — SODIUM CHLORIDE 0.9 % IV BOLUS
1000.0000 mL | Freq: Once | INTRAVENOUS | Status: AC
  Administered 2017-10-09: 1000 mL via INTRAVENOUS

## 2017-10-09 MED ORDER — METOCLOPRAMIDE HCL 5 MG/ML IJ SOLN
10.0000 mg | Freq: Once | INTRAMUSCULAR | Status: AC
  Administered 2017-10-09: 10 mg via INTRAVENOUS
  Filled 2017-10-09: qty 2

## 2017-10-09 MED ORDER — HYDROCHLOROTHIAZIDE 12.5 MG PO CAPS
12.5000 mg | ORAL_CAPSULE | Freq: Every day | ORAL | Status: DC
  Administered 2017-10-09: 12.5 mg via ORAL
  Filled 2017-10-09: qty 1

## 2017-10-09 MED ORDER — DIPHENHYDRAMINE HCL 50 MG/ML IJ SOLN
25.0000 mg | Freq: Once | INTRAMUSCULAR | Status: AC
  Administered 2017-10-09: 25 mg via INTRAVENOUS
  Filled 2017-10-09: qty 1

## 2017-10-09 MED ORDER — KETOROLAC TROMETHAMINE 30 MG/ML IJ SOLN
15.0000 mg | Freq: Once | INTRAMUSCULAR | Status: AC
  Administered 2017-10-09: 15 mg via INTRAVENOUS
  Filled 2017-10-09: qty 1

## 2017-10-09 NOTE — ED Triage Notes (Signed)
Pt here with c/o migraine, states she has been out of her bp meds for about 3 weeks now, usually able to treat migraine at home with excedrine, was not effective this time. Pt states she is also nauseated.

## 2017-10-09 NOTE — ED Provider Notes (Signed)
[facility] Emergency Department Provider Note  ____________________________________________  Time seen: Approximately 12:31 PM  I have reviewed the triage vital signs and the nursing notes.   HISTORY  Chief Complaint Migraine and Hypertension   HPI Jaclyn Mcknight is a 47 y.o. female a history of migraine headaches and hypertension who presents for evaluation of a migraine.  Patient reports that 3 weeks ago she ran out of her antihypertensive medication.  Today she started having a migraine.  She describes blurry vision and visual spots which she usually has before her migraine headaches.  That followed by a headache which is currently 8 out of 10 associated with photophobia, nausea, and blurry vision.  Patient reports that the headache is generalized.  Headache is identical to her prior migraines.  She is usually able to control it with Excedrin but today that has been unsuccessful.  Patient denies weakness or numbness, sudden onset HA or HA with maximal intensity at onset, fever, neck stiffness, history of immunosuppression, Jaw claudication, muscle aches, temporal artery pain, history of other household members with similar symptoms, pregnancy, clotting disorder, trauma, eye pain, recent cervical manipulation, or dizziness with headache.   Past Medical History:  Diagnosis Date  . Anxiety   . Back pain at L4-L5 level   . Gravida 3 para 3   . Hypertension   . Migraines   . Seizures [facility])     Patient Active Problem List   Diagnosis Date Noted  . Encounter for general adult medical examination without abnormal findings 07/14/2016  . Screening for gout 07/14/2016  . Seizure disorder (HCC) 09/19/2014  . Essential (primary) hypertension 09/19/2014  . Migraine without aura and responsive to treatment 09/19/2014  . 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 Atypical chest pain 09/19/2014  . Hypoglycemia 09/19/2014    Past Surgical  History:  Procedure Laterality Date  . 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  . TUBAL LIGATION      Prior to Admission medications   Medication Sig Start Date End Date Taking? Authorizing Provider  guaiFENesin-codeine 100-10 MG/5ML syrup Take 5 mLs by mouth every 6 (six) hours as needed for cough. 03/01/17   Little, Traci M, PA-C  hydrochlorothiazide (HYDRODIURIL) 12.5 MG tablet Take 1 tablet (12.5 mg total) by mouth daily. 10/09/17 11/08/17  Nita Sickle, MD  lisinopril (PRINIVIL,ZESTRIL) 10 MG tablet Take 2 tablets (20 mg total) by mouth daily. 10/09/17 10/09/18  Nita Sickle, MD  lisinopril-hydrochlorothiazide [facility]) 20-12.5 MG tablet TAKE 1 TABLET BY MOUTH ONCE DAILY 07/11/17   Galen Manila, NP    Allergies Patient has no known allergies.  Family History  Problem Relation Age of Onset  . Hypertension Mother   . Diabetes Mother   . Hypertension Sister   . Hypertension Maternal Aunt   . Diabetes Maternal Aunt   . Hypertension Maternal Grandmother   . Diabetes Maternal Grandmother     Social History Social History   Tobacco Use  . Smoking status: Never Smoker  . Smokeless tobacco: Never Used  Substance Use Topics  . Alcohol use: Yes    Alcohol/week: 0.6 oz    Types: 1 Glasses of wine per week    Comment: 2 glasses wine per month.  . Drug use: No    Review of Systems  Constitutional: Negative for fever. Eyes: Negative for visual changes. ENT: Negative for sore throat. Neck: No neck pain  Cardiovascular: Negative for chest pain. Respiratory: Negative for shortness  of breath. Gastrointestinal: Negative for abdominal pain, vomiting or diarrhea. Genitourinary: Negative for dysuria. Musculoskeletal: Negative for back pain. Skin: Negative for rash. Neurological: Negative for  weakness or numbness. + HA Psych: No SI or HI  ____________________________________________   PHYSICAL EXAM:  VITAL SIGNS: ED Triage Vitals [10/09/17  1209]  Enc Vitals Group     BP (!) 210/126     Pulse Rate 75     Resp 18     Temp 98.7 F (37.1 C)     Temp Source Oral     SpO2 100 %     Weight 155 lb (70.3 kg)     Height  (1.676 m)     Head Circumference      Peak Flow      Pain Score 8     Pain Loc      Pain Edu?      Excl. in GC?     Constitutional: Alert and oriented. Well appearing and in no apparent distress. HEENT:      Head: Normocephalic and atraumatic.         Eyes: Conjunctivae are normal. Sclera is non-icteric. PERRL       Mouth/Throat: Mucous membranes are moist.       Neck: Supple with no signs of meningismus. Cardiovascular: Regular rate and rhythm. No murmurs, gallops, or rubs. 2+ symmetrical distal pulses are present in all extremities. No JVD. Respiratory: Normal respiratory effort. Lungs are clear to auscultation bilaterally. No wheezes, crackles, or rhonchi.  Gastrointestinal: Soft, non tender, and non distended with positive bowel sounds. No rebound or guarding. Genitourinary: No CVA tenderness. Musculoskeletal: Nontender with normal range of motion in all extremities. No edema, cyanosis, or erythema of extremities. Neurologic: Normal speech and language. A & O x3, PERRL, EOMI, no nystagmus, CN II-XII intact, motor testing reveals good tone and bulk throughout. There is no evidence of pronator drift or dysmetria. Muscle strength is 5/5 throughout.  Sensory examination is intact. Gait is normal. Skin: Skin is warm, dry and intact. No rash noted. Psychiatric: Mood and affect are normal. Speech and behavior are normal.  ____________________________________________   LABS (all labs ordered are listed, but only abnormal results are displayed)  Labs Reviewed  POCT PREGNANCY, URINE   ____________________________________________  EKG  none  ____________________________________________  RADIOLOGY  none  ____________________________________________   PROCEDURES  Procedure(s) performed:  None Procedures Critical Care performed:  None ____________________________________________   INITIAL IMPRESSION / ASSESSMENT AND PLAN / ED COURSE   47 y.o. female a history of migraine headaches and hypertension who presents for evaluation of a migraine.  Patient is hypertensive and has been out of her medications for 3 weeks which can be contributing to her migraine headache.  She is otherwise well-appearing, neurologically intact.  Will treat with migraine cocktail.  Will restart patient on her lisinopril and hydrochlorothiazide.  Will check pregnancy test.  Low suspicion for more serious or life threatening etiology of HA based on history and exam. No sudden onset thunderclap HA, onset with exertion, vomiting, focal neurologic deficits, to suggest increased risk of subarachnoid hemorrhage. No fever, neck pain, neck stiffness, or meningismus on exam to suggest meningitis. No fevers, altered mental status, unusual behavior to suggest encephalitis. No focal neurologic deficits by history or exam to suggest central venous thrombosis. No constitutional symptoms including fever, fatigue, weight loss, temporal scalp tenderness, jaw claudication, visual loss, to suggest temporal arteritis. No immunocompromise to suggest increased risk for intracranial infectious disease. No visual changes  or findings on ocular exam to suggest acute angle closure glaucoma. No reports of toxic exposures including carbon monoxide or other household members with similar symptoms.    _________________________ 1:53 PM on 10/09/2017 ----------------------------------------- Patient reports full resolution of her headache.  Blood pressure is trending down.  Patient was provided with prescriptions for lisinopril hydrochlorothiazide and recommended follow-up with primary care doctor.    As part of my medical decision making, I reviewed the following data within the electronic MEDICAL RECORD NUMBER Nursing notes reviewed and  incorporated, Labs reviewed , Notes from prior ED visits and [place] Controlled Substance Database    Pertinent labs & imaging results that were available during my care of the patient were reviewed by me and considered in my medical decision making (see chart for details).    ____________________________________________   FINAL CLINICAL IMPRESSION(S) / ED DIAGNOSES  Final diagnoses:  Migraine with aura and with status migrainosus, not intractable  Essential hypertension      NEW MEDICATIONS STARTED DURING THIS VISIT:  ED Discharge Orders        Ordered    hydrochlorothiazide (HYDRODIURIL) 12.5 MG tablet  Daily     10/09/17 1352    lisinopril (PRINIVIL,ZESTRIL) 10 MG tablet  Daily     10/09/17 1352       Note:  This document was prepared using Dragon voice recognition software and may include unintentional dictation errors.    Don Perking, [place], MD 10/09/17 1355

## 2017-11-10 ENCOUNTER — Other Ambulatory Visit: Payer: Self-pay

## 2017-11-10 ENCOUNTER — Ambulatory Visit (INDEPENDENT_AMBULATORY_CARE_PROVIDER_SITE_OTHER): Payer: 59 | Admitting: Nurse Practitioner

## 2017-11-10 ENCOUNTER — Encounter: Payer: Self-pay | Admitting: Nurse Practitioner

## 2017-11-10 VITALS — BP 146/96 | HR 72 | Temp 98.2°F | Ht 66.0 in | Wt 160.8 lb

## 2017-11-10 DIAGNOSIS — N644 Mastodynia: Secondary | ICD-10-CM | POA: Diagnosis not present

## 2017-11-10 DIAGNOSIS — N6310 Unspecified lump in the right breast, unspecified quadrant: Secondary | ICD-10-CM | POA: Diagnosis not present

## 2017-11-10 DIAGNOSIS — I10 Essential (primary) hypertension: Secondary | ICD-10-CM | POA: Diagnosis not present

## 2017-11-10 MED ORDER — LISINOPRIL-HYDROCHLOROTHIAZIDE 20-25 MG PO TABS: 1.0000 | ORAL_TABLET | Freq: Every day | ORAL | 3 refills | Status: DC

## 2017-11-10 NOTE — Progress Notes (Signed)
Subjective:    Patient ID: Jaclyn Mcknight, female    DOB: Oct 11, 1970, 47 y.o.   MRN: [id number]  Jaclyn Mcknight is a 47 y.o. female presenting on 11/10/2017 for Hypertension (pt ran out of the medication and was off for about a month. She was seen in the ER x 1mth ago and was told  uncontrol bp can trigger headaches. )   HPI Hypertension  - She is not checking BP at home or outside of clinic.    - Current medications: lisinopril-hctz 20-12.5 mg oncedaily, tolerating well without side effects - She is not currently symptomatic. - Pt denies headache, lightheadedness, dizziness, changes in vision, chest tightness/pressure, palpitations, leg swelling, sudden loss of speech or loss of consciousness. - She  reports an exercise routine that includes walking, 1-2 days per week. - Her diet is moderate in salt, moderate in fat, and moderate in carbohydrates.    Right Breast Pain 3 weeks ago started having breast pain and lump. In nipple. No change over time.  Aching after manipulation only.  Social History   Tobacco Use  . Smoking status: Never Smoker  . Smokeless tobacco: Never Used  Substance Use Topics  . Alcohol use: Yes    Alcohol/week: 0.6 oz    Types: 1 Glasses of wine per week    Comment: 2 glasses wine per month.  . Drug use: No    Review of Systems Per HPI unless specifically indicated above     Objective:    BP (!) 146/96 (BP Location: Right Arm, Patient Position: Sitting, Cuff Size: Normal)   Pulse 72   Temp 98.2 F (36.8 C) (Oral)   Ht 5\' 6"  (1.676 m)   Wt 160 lb 12.8 oz (72.9 kg)   BMI 25.95 kg/m   Wt Readings from Last 3 Encounters:  11/10/17 160 lb 12.8 oz (72.9 kg)  10/09/17 155 lb (70.3 kg)  03/01/17 154 lb (69.9 kg)    Physical Exam  Constitutional: She is oriented to person, place, and time. She appears well-developed and well-nourished. No distress.  HENT:  Head: Normocephalic and atraumatic.  Cardiovascular: Normal rate, regular rhythm,  S1 normal, S2 normal, normal heart sounds and intact distal pulses.  Pulmonary/Chest: Effort normal and breath sounds normal. No respiratory distress. Right breast exhibits tenderness (at nipple.  Hard lump < 1 cm within nipple). Right breast exhibits no inverted nipple, no mass, no nipple discharge and no skin change. Left breast exhibits no inverted nipple, no mass, no nipple discharge, no skin change and no tenderness. Breasts are symmetrical.  Neurological: She is alert and oriented to person, place, and time.  Skin: Skin is warm and dry.  Psychiatric: She has a normal mood and affect. Her behavior is normal.  Vitals reviewed.  Results for orders placed or performed during the hospital encounter of 10/09/17  Pregnancy, urine POC  Result Value Ref Range   Preg Test, Ur NEGATIVE NEGATIVE      Assessment & Plan:   Problem List Items Addressed This Visit      Cardiovascular and Mediastinum   Essential (primary) hypertension Uncontrolled, but improved hypertension.  BP goal < 130/80.  Pt is not currently working on lifestyle modifications.  Taking medications tolerating well without side effects. No currently identified complications.  Plan: 1. CHANGE medication: - Take lisinopril - HCTZ 20-25 mg once daily 2. Obtain labs  3. Encouraged heart healthy diet and increasing exercise to 30 minutes most days of the week. 4. Check  BP 1-2 x per week at home, keep log, and bring to clinic at next appointment. 5. Follow up 6 weeks.     Relevant Medications   lisinopril-hydrochlorothiazide (PRINZIDE,ZESTORETIC) 20-25 MG tablet    Other Visit Diagnoses    Breast pain, right    -  Primary   Relevant Orders   MM DIAG BREAST TOMO BILATERAL (Completed)   US BREAST LTD UNI RIGHT INC AXILLA (Completed)   Lump of right breast       Relevant Orders   MM DIAG BREAST TOMO BILATERAL (Completed)   US BREAST LTD UNI RIGHT INC AXILLA (Completed)    Subacute lump in R breast at nipple.  Painful to  touch. - Diagnostic mammo and [place]. - Provided patient information to call if desired, but will receive scheduleing phone call. - Reviewed possible outcomes with patient and included cyst, benign, or cancerous mass. - Followup prn after results.  Meds ordered this encounter  Medications  . lisinopril-hydrochlorothiazide (PRINZIDE,ZESTORETIC) 20-25 MG tablet    Sig: Take 1 tablet by mouth daily.    Dispense:  90 tablet    Refill:  3    Order Specific Question:   Supervising Provider    Answer:   Smitty Cords [[id number]]    Follow up plan: Return in about 6 weeks (around 12/22/2017) for hypertension.  Wilhelmina Mcardle, DNP, AGPCNP-BC Adult Gerontology Primary Care Nurse Practitioner [facility] 11/10/2017, 4:07 PM

## 2017-11-10 NOTE — Patient Instructions (Addendum)
Jaclyn Mcknight,   Thank you for coming in to clinic today.  1. Labs Tomorrow at 8AM - Come fasting for cholesterol check.  You will be due for FASTING BLOOD WORK.  This means you should eat no food or drink after midnight.  Drink only water or coffee without cream/sugar on the morning of your lab visit. - Please go ahead and schedule a "Lab Only" visit in the morning at the clinic for lab draw TOMORROW. - Your results will be available about 2-3 days after blood draw.  If you have set up a MyChart account, you can can log in to MyChart online to view your results and a brief explanation. Also, we can discuss your results together at your next office visit if you would like.   2. INCREASE hydrochlorothiazide to 25 mg daily. TAKE lisinopril - HCTZ 20-25 mg tablet once daily.  3. You will need a mammogram and ultrasound.  Orders are placed.  They will call to schedule.  If needed, you can also call the Scheduling phone number at [phone]   [facility]  [facility]  [address]  [place], [place] [address]   [facility], [facility] [place] [facility] [address] [place], [place] [address]   Please schedule a follow-up appointment with Wilhelmina McardleLauren Elynn Patteson, AGNP. Return in about 6 weeks (around 12/22/2017) for hypertension.    If you have any other questions or concerns, please feel free to call the clinic or send a message through MyChart. You may also schedule an earlier appointment if necessary.  You will receive a survey after today's visit either digitally by e-mail or paper by [employer] mail. Your experiences and feedback matter to us.  Please respond so we know how we are doing as we provide care for you.   Wilhelmina McardleLauren Kerryann Allaire, DNP, AGNP-BC Adult Gerontology Nurse Practitioner [facility], [facility]

## 2017-11-11 ENCOUNTER — Other Ambulatory Visit: Payer: 59

## 2017-11-17 ENCOUNTER — Ambulatory Visit
Admission: RE | Admit: 2017-11-17 | Discharge: 2017-11-17 | Disposition: A | Discharge status: Home / Self Care | Payer: 59 | Source: Ambulatory Visit | Attending: Nurse Practitioner | Admitting: Nurse Practitioner

## 2017-11-17 DIAGNOSIS — N6310 Unspecified lump in the right breast, unspecified quadrant: Secondary | ICD-10-CM | POA: Diagnosis present

## 2017-11-17 DIAGNOSIS — N644 Mastodynia: Secondary | ICD-10-CM | POA: Insufficient documentation

## 2017-11-21 ENCOUNTER — Other Ambulatory Visit: Payer: Self-pay | Admitting: Nurse Practitioner

## 2017-11-21 DIAGNOSIS — N644 Mastodynia: Secondary | ICD-10-CM

## 2017-11-21 DIAGNOSIS — N6310 Unspecified lump in the right breast, unspecified quadrant: Secondary | ICD-10-CM

## 2017-11-21 DIAGNOSIS — R928 Other abnormal and inconclusive findings on diagnostic imaging of breast: Secondary | ICD-10-CM

## 2017-11-28 ENCOUNTER — Other Ambulatory Visit: Payer: Self-pay

## 2017-11-28 DIAGNOSIS — I10 Essential (primary) hypertension: Secondary | ICD-10-CM

## 2017-11-29 LAB — COMPREHENSIVE METABOLIC PANEL
AG Ratio: 1.5 (calc) (ref 1.0–2.5)
ALT: 7 U/L (ref 6–29)
AST: 14 U/L (ref 10–35)
Albumin: 4 g/dL (ref 3.6–5.1)
Alkaline phosphatase (APISO): 98 U/L (ref 33–115)
BUN: 13 mg/dL (ref 7–25)
CO2: 31 mmol/L (ref 20–32)
Calcium: 9.3 mg/dL (ref 8.6–10.2)
Chloride: 103 mmol/L (ref 98–110)
Creat: 0.94 mg/dL (ref 0.50–1.10)
Globulin: 2.7 g/dL (calc) (ref 1.9–3.7)
Glucose, Bld: 82 mg/dL (ref 65–99)
Potassium: 4.6 mmol/L (ref 3.5–5.3)
Sodium: 140 mmol/L (ref 135–146)
Total Bilirubin: 0.4 mg/dL (ref 0.2–1.2)
Total Protein: 6.7 g/dL (ref 6.1–8.1)

## 2017-12-05 ENCOUNTER — Ambulatory Visit: Payer: 59 | Admitting: Surgery

## 2017-12-06 ENCOUNTER — Encounter: Payer: Self-pay | Admitting: Surgery

## 2017-12-06 ENCOUNTER — Ambulatory Visit (INDEPENDENT_AMBULATORY_CARE_PROVIDER_SITE_OTHER): Payer: 59 | Admitting: Surgery

## 2017-12-06 VITALS — BP 138/95 | HR 73 | Temp 97.9°F | Ht 66.0 in | Wt 161.0 lb

## 2017-12-06 DIAGNOSIS — N63 Unspecified lump in unspecified breast: Secondary | ICD-10-CM | POA: Diagnosis not present

## 2017-12-06 NOTE — H&P (View-Only) (Signed)
Patient ID: Jaclyn Mcknight, female   DOB: 01/24/1971, 47 y.o.   MRN: [id number]  HPI Jaclyn Mcknight is a 47 y.o. female seen in consultation at the request of Jaclyn Mcknight.  She reports a right breast nodule for the last month or so.  Also reports some intermittent sharp pains that are mild to moderate intensity.  Apparently the pain gets better when she palpates her breast.  No fevers no chills no evidence of nipple drainage. Currently 2 nieces had a breast biopsy for benign disease.  She had 3 pregnancy did not breast-feed no hormonal therapy.  No hysterectomy.  Ultrasound and mammogram were personally reviewed showing a 0.7 x 0.7 complex cystic lesion within the right nipple.  HPI  Past Medical History:  Diagnosis Date  . Anxiety   . Back pain at L4-L5 level   . Gravida 3 para 3   . Hypertension   . Migraines   . Seizures (HCC)     Past Surgical History:  Procedure Laterality Date  . 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  . TUBAL LIGATION      Family History  Problem Relation Age of Onset  . Hypertension Mother   . Diabetes Mother   . Hypertension Sister   . Hypertension Maternal Aunt   . Diabetes Maternal Aunt   . Hypertension Maternal Grandmother   . Diabetes Maternal Grandmother     Social History Social History   Tobacco Use  . Smoking status: Never Smoker  . Smokeless tobacco: Never Used  Substance Use Topics  . Alcohol use: Yes    Alcohol/week: 0.6 oz    Types: 1 Glasses of wine per week    Comment: 2 glasses wine per month.  . Drug use: No    No Known Allergies  Current Outpatient Medications  Medication Sig Dispense Refill  . lisinopril-hydrochlorothiazide (PRINZIDE,ZESTORETIC) 20-25 MG tablet Take 1 tablet by mouth daily. 90 tablet 3   No current facility-administered medications for this visit.      Review of Systems Full ROS  was asked and was negative except for the information on the HPI  Physical Exam Blood pressure (!)  138/95, pulse 73, temperature 97.9 F (36.6 C), temperature source Oral, height 5' 6" (1.676 m), weight 73 kg (161 lb), last menstrual period 11/07/2017. CONSTITUTIONAL: NAD EYES: Pupils are equal, round, and reactive to light, Sclera are non-icteric. EARS, NOSE, MOUTH AND THROAT: The oropharynx is clear. The oral mucosa is pink and moist. Hearing is intact to voice. LYMPH NODES:  Lymph nodes in the neck are normal. RESPIRATORY:  Lungs are clear. There is normal respiratory effort, with equal breath sounds bilaterally, and without pathologic use of accessory muscles. CARDIOVASCULAR: Heart is regular without murmurs, gallops, or rubs. BREAST: Left partially inverted nipple there is a small nodule within the right nipple , located 9 oclock.. No other masses, or discharge. Axilla w/o disease GI: The abdomen is  soft, nontender, and nondistended. There are no palpable masses. There is no hepatosplenomegaly. There are normal bowel sounds in all quadrants. GU: Rectal deferred.   MUSCULOSKELETAL: Normal muscle strength and tone. No cyanosis or edema.   SKIN: Turgor is good and there are no pathologic skin lesions or ulcers. NEUROLOGIC: Motor and sensation is grossly normal. Cranial nerves are grossly intact. PSYCH:  Oriented to person, place and time. Affect is normal.  Data Reviewed I have personally reviewed the patient's imaging, laboratory findings and medical records.    Assessment/Plan   Palpable Right nipple mass . D/W pt in detail and I do recommend Excisional biopsy.  This with the patient detail about the risk benefit and possible complications including but not limited to: Bleeding, infection, need for reexcision and potentially sentinel node biopsy if in fact this is malignant. She understands and wishes to proceed.   , MD FACS General Surgeon 12/06/2017, 2:50 PM   

## 2017-12-06 NOTE — Progress Notes (Signed)
Patient ID: Jaclyn Mcknight, female   DOB: March 16, 1971, 47 y.o.   MRN: [id number]  HPI Jaclyn Mcknight is a 47 y.o. female seen in consultation at the request of Mrs. Kyung Rudd.  She reports a right breast nodule for the last month or so.  Also reports some intermittent sharp pains that are mild to moderate intensity.  Apparently the pain gets better when she palpates her breast.  No fevers no chills no evidence of nipple drainage. Currently 2 nieces had a breast biopsy for benign disease.  She had 3 pregnancy did not breast-feed no hormonal therapy.  No hysterectomy.  Ultrasound and mammogram were personally reviewed showing a 0.7 x 0.7 complex cystic lesion within the right nipple.  HPI  Past Medical History:  Diagnosis Date  . Anxiety   . Back pain at L4-L5 level   . Gravida 3 para 3   . Hypertension   . Migraines   . Seizures (HCC)     Past Surgical History:  Procedure Laterality Date  . 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  . TUBAL LIGATION      Family History  Problem Relation Age of Onset  . Hypertension Mother   . Diabetes Mother   . Hypertension Sister   . Hypertension Maternal Aunt   . Diabetes Maternal Aunt   . Hypertension Maternal Grandmother   . Diabetes Maternal Grandmother     Social History Social History   Tobacco Use  . Smoking status: Never Smoker  . Smokeless tobacco: Never Used  Substance Use Topics  . Alcohol use: Yes    Alcohol/week: 0.6 oz    Types: 1 Glasses of wine per week    Comment: 2 glasses wine per month.  . Drug use: No    No Known Allergies  Current Outpatient Medications  Medication Sig Dispense Refill  . lisinopril-hydrochlorothiazide (PRINZIDE,ZESTORETIC) 20-25 MG tablet Take 1 tablet by mouth daily. 90 tablet 3   No current facility-administered medications for this visit.      Review of Systems Full ROS  was asked and was negative except for the information on the HPI  Physical Exam Blood pressure (!)  138/95, pulse 73, temperature 97.9 F (36.6 C), temperature source Oral, height 5\' 6"  (1.676 m), weight 73 kg (161 lb), last menstrual period 11/07/2017. CONSTITUTIONAL: NAD EYES: Pupils are equal, round, and reactive to light, Sclera are non-icteric. EARS, NOSE, MOUTH AND THROAT: The oropharynx is clear. The oral mucosa is pink and moist. Hearing is intact to voice. LYMPH NODES:  Lymph nodes in the neck are normal. RESPIRATORY:  Lungs are clear. There is normal respiratory effort, with equal breath sounds bilaterally, and without pathologic use of accessory muscles. CARDIOVASCULAR: Heart is regular without murmurs, gallops, or rubs. BREAST: Left partially inverted nipple there is a small nodule within the right nipple , located 9 oclock.. No other masses, or discharge. Axilla w/o disease GI: The abdomen is  soft, nontender, and nondistended. There are no palpable masses. There is no hepatosplenomegaly. There are normal bowel sounds in all quadrants. GU: Rectal deferred.   MUSCULOSKELETAL: Normal muscle strength and tone. No cyanosis or edema.   SKIN: Turgor is good and there are no pathologic skin lesions or ulcers. NEUROLOGIC: Motor and sensation is grossly normal. Cranial nerves are grossly intact. PSYCH:  Oriented to person, place and time. Affect is normal.  Data Reviewed I have personally reviewed the patient's imaging, laboratory findings and medical records.    Assessment/Plan  Palpable Right nipple mass . D/W pt in detail and I do recommend Excisional biopsy.  This with the patient detail about the risk benefit and possible complications including but not limited to: Bleeding, infection, need for reexcision and potentially sentinel node biopsy if in fact this is malignant. She understands and wishes to proceed.  Sterling Bigiego Lani Mendiola, MD FACS General Surgeon 12/06/2017, 2:50 PM

## 2017-12-06 NOTE — Patient Instructions (Signed)
Your [facility] by Dr. Sterling Bigiego Pabon.   Please look at your blue sheet in case you have any questions or concerns about your surgery.

## 2017-12-09 ENCOUNTER — Telehealth: Payer: Self-pay | Admitting: Surgery

## 2017-12-09 NOTE — Telephone Encounter (Signed)
Pt advised of pre op date/time and sx date. Sx: 12/22/17 with Dr Pabon-excision of right breast mass. Pre op: 12/15/17 between 9-1:00pm--phone interview  Patient made aware to call [phone], between 1-3:00pm the day before surgery, to find out what time to arrive.

## 2017-12-15 ENCOUNTER — Encounter
Admission: RE | Admit: 2017-12-15 | Discharge: 2017-12-15 | Disposition: A | Discharge status: Home / Self Care | Payer: 59 | Source: Ambulatory Visit | Attending: Surgery | Admitting: Surgery

## 2017-12-15 ENCOUNTER — Other Ambulatory Visit: Payer: Self-pay

## 2017-12-15 NOTE — Patient Instructions (Signed)
Your procedure is scheduled on: 12-22-17 THURSDAY Report to Same Day Surgery 2nd floor medical mall [facility] Entrance-take elevator on left to 2nd floor.  Check in with surgery information desk.) To find out your arrival time please call [phone] between 1PM - 3PM on 12-21-17 [facility]  Remember: Instructions that are not followed completely may result in serious medical risk, up to and including death, or upon the discretion of your surgeon and anesthesiologist your surgery may need to be rescheduled.    _x___ 1. Do not eat food after midnight the night before your procedure. NO GUM OR CANDY AFTER MIDNIGHT.  You may drink clear liquids up to 2 hours before you are scheduled to arrive at the hospital for your procedure.  Do not drink clear liquids within 2 hours of your scheduled arrival to the hospital.  Clear liquids include  --Water or Apple juice without pulp  --Clear carbohydrate beverage such as ClearFast or Gatorade  --Black Coffee or Clear Tea (No milk, no creamers, do not add anything to the coffee or Tea    __x__ 2. No Alcohol for 24 hours before or after surgery.   __x__3. No Smoking or e-cigarettes for 24 prior to surgery.  Do not use any chewable tobacco products for at least 6 hour prior to surgery   ____  4. Bring all medications with you on the day of surgery if instructed.    __x__ 5. Notify your doctor if there is any change in your medical condition     (cold, fever, infections).    x___6. On the morning of surgery brush your teeth with toothpaste and water.  You may rinse your mouth with mouth wash if you wish.  Do not swallow any toothpaste or mouthwash.   Do not wear jewelry, make-up, hairpins, clips or nail polish.  Do not wear lotions, powders, or perfumes. You may wear deodorant.  Do not shave 48 hours prior to surgery. Men may shave face and neck.  Do not bring valuables to the hospital.    [facility] is not responsible for any belongings or  valuables.               Contacts, dentures or bridgework may not be worn into surgery.  Leave your suitcase in the car. After surgery it may be brought to your room.  For patients admitted to the hospital, discharge time is determined by your treatment team.  _  Patients discharged the day of surgery will not be allowed to drive home.  You will need someone to drive you home and stay with you the night of your procedure.    Please read over the following fact sheets that you were given:   [facility] Preparing for Surgery    ____ Take anti-hypertensive listed below, cardiac, seizure, asthma, anti-reflux and psychiatric medicines. These include:  1. NONE  2.  3.  4.  5.  6.  ____Fleets enema or Magnesium Citrate as directed.   _x___ Use CHG Soap or sage wipes as directed on instruction sheet   ____ Use inhalers on the day of surgery and bring to hospital day of surgery  ____ Stop Metformin and Janumet 2 days prior to surgery.    ____ Take 1/2 of usual insulin dose the night before surgery and none on the morning surgery.   ____ Follow recommendations from Cardiologist, Pulmonologist or PCP regarding stopping Aspirin, Coumadin, Plavix ,Eliquis, Effient, or Pradaxa, and Pletal.  X____Stop Anti-inflammatories such as Advil,  Aleve, Ibuprofen, Motrin, Naproxen, Naprosyn, Goodies powders or aspirin products NOW- OK to take Tylenol    ____ Stop supplements until after surgery.     ____ Bring C-Pap to the hospital.

## 2017-12-19 ENCOUNTER — Encounter
Admission: RE | Admit: 2017-12-19 | Discharge: 2017-12-19 | Disposition: A | Discharge status: Home / Self Care | Payer: 59 | Source: Ambulatory Visit | Attending: Surgery | Admitting: Surgery

## 2017-12-19 DIAGNOSIS — N6341 Unspecified lump in right breast, subareolar: Secondary | ICD-10-CM | POA: Diagnosis present

## 2017-12-19 DIAGNOSIS — Z01810 Encounter for preprocedural cardiovascular examination: Secondary | ICD-10-CM | POA: Diagnosis not present

## 2017-12-19 DIAGNOSIS — Z79899 Other long term (current) drug therapy: Secondary | ICD-10-CM | POA: Diagnosis not present

## 2017-12-19 DIAGNOSIS — N6452 Nipple discharge: Secondary | ICD-10-CM | POA: Diagnosis not present

## 2017-12-19 DIAGNOSIS — F419 Anxiety disorder, unspecified: Secondary | ICD-10-CM | POA: Diagnosis not present

## 2017-12-19 DIAGNOSIS — I10 Essential (primary) hypertension: Secondary | ICD-10-CM | POA: Diagnosis not present

## 2017-12-19 DIAGNOSIS — N6041 Mammary duct ectasia of right breast: Secondary | ICD-10-CM | POA: Diagnosis not present

## 2017-12-20 ENCOUNTER — Telehealth: Payer: Self-pay | Admitting: Surgery

## 2017-12-20 MED ORDER — CEFAZOLIN SODIUM-DEXTROSE 2-4 GM/100ML-% IV SOLN
2.0000 g | INTRAVENOUS | Status: AC
  Administered 2017-12-21: 2 g via INTRAVENOUS

## 2017-12-20 NOTE — Telephone Encounter (Signed)
Surgery date has changed from 12/22/17 to 12/21/17. Patient is aware and ok with date change.   Sx date: 12/21/17 with Dr Pabon-excision of right breast nipple mass.

## 2017-12-21 ENCOUNTER — Ambulatory Visit: Payer: 59 | Admitting: Registered Nurse

## 2017-12-21 ENCOUNTER — Other Ambulatory Visit: Payer: Self-pay

## 2017-12-21 ENCOUNTER — Encounter: Payer: Self-pay | Admitting: *Deleted

## 2017-12-21 ENCOUNTER — Encounter
Admission: RE | Disposition: A | Discharge status: Home / Self Care | Payer: Self-pay | Source: Ambulatory Visit | Attending: Surgery

## 2017-12-21 ENCOUNTER — Ambulatory Visit
Admission: RE | Admit: 2017-12-21 | Discharge: 2017-12-21 | Disposition: A | Discharge status: Home / Self Care | Payer: 59 | Source: Ambulatory Visit | Attending: Surgery | Admitting: Surgery

## 2017-12-21 DIAGNOSIS — N6341 Unspecified lump in right breast, subareolar: Secondary | ICD-10-CM | POA: Insufficient documentation

## 2017-12-21 DIAGNOSIS — Z79899 Other long term (current) drug therapy: Secondary | ICD-10-CM | POA: Insufficient documentation

## 2017-12-21 DIAGNOSIS — N649 Disorder of breast, unspecified: Secondary | ICD-10-CM | POA: Diagnosis not present

## 2017-12-21 DIAGNOSIS — N6041 Mammary duct ectasia of right breast: Secondary | ICD-10-CM | POA: Insufficient documentation

## 2017-12-21 DIAGNOSIS — I10 Essential (primary) hypertension: Secondary | ICD-10-CM | POA: Insufficient documentation

## 2017-12-21 DIAGNOSIS — Z01810 Encounter for preprocedural cardiovascular examination: Secondary | ICD-10-CM | POA: Insufficient documentation

## 2017-12-21 DIAGNOSIS — F419 Anxiety disorder, unspecified: Secondary | ICD-10-CM | POA: Insufficient documentation

## 2017-12-21 DIAGNOSIS — N6452 Nipple discharge: Secondary | ICD-10-CM | POA: Insufficient documentation

## 2017-12-21 HISTORY — PX: BREAST LUMPECTOMY: SHX2

## 2017-12-21 LAB — URINE DRUG SCREEN, QUALITATIVE (ARMC ONLY)
Amphetamines, Ur Screen: NOT DETECTED
BARBITURATES, UR SCREEN: NOT DETECTED
BENZODIAZEPINE, UR SCRN: NOT DETECTED
CANNABINOID 50 NG, UR ~~LOC~~: NOT DETECTED
Cocaine Metabolite,Ur ~~LOC~~: NOT DETECTED
MDMA (Ecstasy)Ur Screen: NOT DETECTED
Methadone Scn, Ur: NOT DETECTED
Opiate, Ur Screen: NOT DETECTED
PHENCYCLIDINE (PCP) UR S: NOT DETECTED
TRICYCLIC, UR SCREEN: NOT DETECTED

## 2017-12-21 LAB — POCT PREGNANCY, URINE: PREG TEST UR: NEGATIVE

## 2017-12-21 SURGERY — BREAST LUMPECTOMY
Anesthesia: General | Site: Breast | Laterality: Right | Wound class: Clean

## 2017-12-21 MED ORDER — HYDROCODONE-ACETAMINOPHEN 5-325 MG PO TABS: 1.0000 | ORAL_TABLET | Freq: Four times a day (QID) | ORAL | 0 refills | Status: DC | PRN

## 2017-12-21 MED ORDER — OXYCODONE HCL 5 MG/5ML PO SOLN: 5.0000 mg | Freq: Once | ORAL | Status: DC | PRN

## 2017-12-21 MED ORDER — MIDAZOLAM HCL 2 MG/2ML IJ SOLN
INTRAMUSCULAR | Status: DC | PRN
  Administered 2017-12-21: 2 mg via INTRAVENOUS

## 2017-12-21 MED ORDER — KETOROLAC TROMETHAMINE 30 MG/ML IJ SOLN
INTRAMUSCULAR | Status: DC | PRN
  Administered 2017-12-21: 30 mg via INTRAVENOUS

## 2017-12-21 MED ORDER — CHLORHEXIDINE GLUCONATE CLOTH 2 % EX PADS: 6.0000 | MEDICATED_PAD | Freq: Once | CUTANEOUS | Status: DC

## 2017-12-21 MED ORDER — CEFAZOLIN SODIUM-DEXTROSE 2-4 GM/100ML-% IV SOLN
INTRAVENOUS | Status: AC
  Filled 2017-12-21: qty 100

## 2017-12-21 MED ORDER — FENTANYL CITRATE (PF) 100 MCG/2ML IJ SOLN: 25.0000 ug | INTRAMUSCULAR | Status: DC | PRN

## 2017-12-21 MED ORDER — ONDANSETRON HCL 4 MG/2ML IJ SOLN
INTRAMUSCULAR | Status: DC | PRN
  Administered 2017-12-21: 4 mg via INTRAVENOUS

## 2017-12-21 MED ORDER — LIDOCAINE HCL (CARDIAC) PF 100 MG/5ML IV SOSY
PREFILLED_SYRINGE | INTRAVENOUS | Status: DC | PRN
  Administered 2017-12-21: 80 mg via INTRAVENOUS

## 2017-12-21 MED ORDER — DEXAMETHASONE SODIUM PHOSPHATE 10 MG/ML IJ SOLN
INTRAMUSCULAR | Status: DC | PRN
  Administered 2017-12-21: 5 mg via INTRAVENOUS

## 2017-12-21 MED ORDER — FAMOTIDINE 20 MG PO TABS
ORAL_TABLET | ORAL | Status: AC
  Administered 2017-12-21: 20 mg via ORAL
  Filled 2017-12-21: qty 1

## 2017-12-21 MED ORDER — LACTATED RINGERS IV SOLN
INTRAVENOUS | Status: DC
  Administered 2017-12-21: 12:00:00 via INTRAVENOUS

## 2017-12-21 MED ORDER — FENTANYL CITRATE (PF) 100 MCG/2ML IJ SOLN
INTRAMUSCULAR | Status: DC | PRN
  Administered 2017-12-21 (×2): 50 ug via INTRAVENOUS

## 2017-12-21 MED ORDER — BUPIVACAINE-EPINEPHRINE (PF) 0.25% -1:200000 IJ SOLN
INTRAMUSCULAR | Status: AC
  Filled 2017-12-21: qty 30

## 2017-12-21 MED ORDER — MIDAZOLAM HCL 2 MG/2ML IJ SOLN
INTRAMUSCULAR | Status: AC
  Filled 2017-12-21: qty 2

## 2017-12-21 MED ORDER — PROPOFOL 10 MG/ML IV BOLUS
INTRAVENOUS | Status: DC | PRN
  Administered 2017-12-21: 160 mg via INTRAVENOUS

## 2017-12-21 MED ORDER — FENTANYL CITRATE (PF) 100 MCG/2ML IJ SOLN
INTRAMUSCULAR | Status: AC
  Filled 2017-12-21: qty 2

## 2017-12-21 MED ORDER — BUPIVACAINE-EPINEPHRINE (PF) 0.25% -1:200000 IJ SOLN
INTRAMUSCULAR | Status: DC | PRN
  Administered 2017-12-21: 30 mL

## 2017-12-21 MED ORDER — MIDAZOLAM HCL 2 MG/2ML IJ SOLN: INTRAMUSCULAR | Status: DC | PRN

## 2017-12-21 MED ORDER — OXYCODONE HCL 5 MG PO TABS: 5.0000 mg | ORAL_TABLET | Freq: Once | ORAL | Status: DC | PRN

## 2017-12-21 MED ORDER — ISOSULFAN BLUE 1 % ~~LOC~~ SOLN
SUBCUTANEOUS | Status: AC
  Filled 2017-12-21: qty 5

## 2017-12-21 MED ORDER — FAMOTIDINE 20 MG PO TABS
20.0000 mg | ORAL_TABLET | Freq: Once | ORAL | Status: AC
  Administered 2017-12-21: 20 mg via ORAL

## 2017-12-21 SURGICAL SUPPLY — 40 items
APPLIER CLIP 9.375 SM OPEN (CLIP)
BLADE SURG 15 STRL LF DISP TIS (BLADE) ×2 IMPLANT
BLADE SURG 15 STRL SS (BLADE) ×4
CANISTER SUCT 1200ML W/VALVE (MISCELLANEOUS) ×3 IMPLANT
CHLORAPREP W/TINT 26ML (MISCELLANEOUS) ×3 IMPLANT
CLIP APPLIE 9.375 SM OPEN (CLIP) IMPLANT
CNTNR SPEC 2.5X3XGRAD LEK (MISCELLANEOUS)
CONT SPEC 4OZ STER OR WHT (MISCELLANEOUS)
CONTAINER SPEC 2.5X3XGRAD LEK (MISCELLANEOUS) IMPLANT
COVER PROBE FLX POLY STRL (MISCELLANEOUS) IMPLANT
DERMABOND ADVANCED (GAUZE/BANDAGES/DRESSINGS) ×2
DERMABOND ADVANCED .7 DNX12 (GAUZE/BANDAGES/DRESSINGS) ×1 IMPLANT
DRAPE INCISE IOBAN 66X45 STRL (DRAPES) ×3 IMPLANT
DRAPE LAPAROTOMY TRNSV 106X77 (MISCELLANEOUS) ×3 IMPLANT
ELECT REM PT RETURN 9FT ADLT (ELECTROSURGICAL) ×3
ELECTRODE REM PT RTRN 9FT ADLT (ELECTROSURGICAL) ×1 IMPLANT
GLOVE BIO SURGEON STRL SZ7 (GLOVE) ×3 IMPLANT
GLOVE BIOGEL PI IND STRL 7.0 (GLOVE) ×5 IMPLANT
GLOVE BIOGEL PI INDICATOR 7.0 (GLOVE) ×10
GLOVE INDICATOR 7.5 STRL GRN (GLOVE) ×3 IMPLANT
GOWN STRL REUS W/ TWL LRG LVL3 (GOWN DISPOSABLE) ×3 IMPLANT
GOWN STRL REUS W/TWL LRG LVL3 (GOWN DISPOSABLE) ×6
KIT TURNOVER KIT A (KITS) ×3 IMPLANT
LABEL OR SOLS (LABEL) ×3 IMPLANT
MARGIN MAP 10MM (MISCELLANEOUS) ×3 IMPLANT
NEEDLE HYPO 22GX1.5 SAFETY (NEEDLE) ×3 IMPLANT
PACK BASIN MINOR ARMC (MISCELLANEOUS) ×3 IMPLANT
SLEVE PROBE SENORX GAMMA FIND (MISCELLANEOUS) IMPLANT
SPONGE LAP 18X18 RF (DISPOSABLE) ×3 IMPLANT
SUT ETHILON 3-0 FS-10 30 BLK (SUTURE)
SUT MNCRL 4-0 (SUTURE) ×2
SUT MNCRL 4-0 27XMFL (SUTURE) ×1
SUT SILK 2 0 SH (SUTURE) ×3 IMPLANT
SUT VIC AB 2-0 CT1 (SUTURE) IMPLANT
SUT VIC AB 3-0 SH 27 (SUTURE) ×2
SUT VIC AB 3-0 SH 27X BRD (SUTURE) ×1 IMPLANT
SUTURE EHLN 3-0 FS-10 30 BLK (SUTURE) IMPLANT
SUTURE MNCRL 4-0 27XMF (SUTURE) ×1 IMPLANT
SYR 20CC LL (SYRINGE) ×3 IMPLANT
WATER STERILE IRR 1000ML POUR (IV SOLUTION) ×3 IMPLANT

## 2017-12-21 NOTE — Transfer of Care (Signed)
Immediate Anesthesia Transfer of Care Note  Patient: Jaclyn Mcknight  Procedure(s) Performed: EXCISION RIGHT BREAST NIPPLE MASS (Right Breast)  Patient Location: PACU  Anesthesia Type:General  Level of Consciousness: awake, alert  and oriented  Airway & Oxygen Therapy: Patient Spontanous Breathing and Patient connected to nasal cannula oxygen  Post-op Assessment: Report given to RN and Post -op Vital signs reviewed and stable  Post vital signs: Reviewed and stable  Last Vitals:  Vitals Value Taken Time  BP    Temp    Pulse    Resp    SpO2      Last Pain:  Vitals:   12/21/17 1032  TempSrc: Tympanic  PainSc: 0-No pain         Complications: No apparent anesthesia complications

## 2017-12-21 NOTE — Anesthesia Preprocedure Evaluation (Signed)
Anesthesia Evaluation  Patient identified by MRN, date of birth, ID band Patient awake    Reviewed: Allergy & Precautions, H&P , NPO status , Patient's Chart, lab work & pertinent test results  History of Anesthesia Complications Negative for: history of anesthetic complications  Airway Mallampati: II  TM Distance: >3 FB Neck ROM: full    Dental  (+) Chipped, Poor Dentition   Pulmonary neg pulmonary ROS, neg shortness of breath,           Cardiovascular Exercise Tolerance: Good hypertension, (-) angina(-) Past MI and (-) DOE negative cardio ROS       Neuro/Psych  Headaches, Seizures -, Well Controlled,  PSYCHIATRIC DISORDERS Anxiety negative psych ROS   GI/Hepatic negative GI ROS, Neg liver ROS, neg GERD  ,  Endo/Other  negative endocrine ROS  Renal/GU      Musculoskeletal   Abdominal   Peds  Hematology negative hematology ROS (+)   Anesthesia Other Findings Past Medical History: No date: Anxiety No date: Back pain at L4-L5 level No date: Gravida 3 para 3 No date: Hypertension No date: Migraines     Comment:  MIGRAINES No date: Seizures (HCC)     Comment:  X1 IN 2016  Past Surgical History: 1999: BRAIN SURGERY     Comment:  benign tumor removal. No date: TUBAL LIGATION  BMI    Body Mass Index:  25.52 kg/m      Reproductive/Obstetrics negative OB ROS                             Anesthesia Physical Anesthesia Plan  ASA: III  Anesthesia Plan: General LMA   Post-op Pain Management:    Induction: Intravenous  PONV Risk Score and Plan: Ondansetron, Dexamethasone and Midazolam  Airway Management Planned: LMA  Additional Equipment:   Intra-op Plan:   Post-operative Plan: Extubation in OR  Informed Consent: I have reviewed the patients History and Physical, chart, labs and discussed the procedure including the risks, benefits and alternatives for the proposed  anesthesia with the patient or authorized representative who has indicated his/her understanding and acceptance.   Dental Advisory Given  Plan Discussed with: Anesthesiologist, CRNA and Surgeon  Anesthesia Plan Comments: (Patient consented for risks of anesthesia including but not limited to:  - adverse reactions to medications - damage to teeth, lips or other oral mucosa - sore throat or hoarseness - Damage to heart, brain, lungs or loss of life  Patient voiced understanding.)        Anesthesia Quick Evaluation

## 2017-12-21 NOTE — Interval H&P Note (Signed)
History and Physical Interval Note:  12/21/2017 11:27 AM  Jaclyn Mcknight  has presented today for surgery, with the diagnosis of RIGHT BREAST MASS  The various methods of treatment have been discussed with the patient and family. After consideration of risks, benefits and other options for treatment, the patient has consented to  Procedure(s): EXCISION RIGHT BREAST NIPPLE MASS (Right) as a surgical intervention .  The patient's history has been reviewed, patient examined, no change in status, stable for surgery.  I have reviewed the patient's chart and labs.  Questions were answered to the patient's satisfaction.     Diego F Pabon

## 2017-12-21 NOTE — Op Note (Signed)
  12/21/2017  12:35 PM  PATIENT:  Jaclyn Mcknight  47 y.o. female  PRE-OPERATIVE DIAGNOSIS:  Nipple lesion and discharge  POST-OPERATIVE DIAGNOSIS:  Same  PROCEDURE:  Right breast Central duct excision   SURGEON:  Surgeon(s) and Role:    * Pabon, Diego F, MD - Primary  EBL: minimal  FINDINGS: Right breast nipple discharge retro areolar nodule  ANESTHESIA: GETA  INDICATIONS FOR PROCEDURE Subareolar cyst and Nipple discharge  DICTATION:  Patient was explained  about procedure in detail, risk benefits possible complications and a consent was obtained. The patient taken to the operating room and placed in the supine position.   Were able to palpate the areolar lesion and there was also discharged on the right breast and nipple retraction.  Using a lacrimal probe were able to explore it.  The incision was created around the inferior Arriola using a 15 blade knife.  Flaps were created using electrocautery.  We were able to release and create flaps underneath the nipple to incorporate the mass and ductal system.  The excision was performed with white margins in case there was evidence of malignancy.  The specimen was oriented in the standard fashion and sent for permanent biopsy.  The wound was irrigated and closed in a 2 layer fashion with 3-0 Vicryl and 4-0 Monocryl for the skin. Marcaine quarter percent with epinephrine was injected around the wound site. Dermabond was used to coat the skin   Needle and laparotomy counts were correct and there were no immediate complications  Leafy Roiego F Pabon, MD

## 2017-12-21 NOTE — Anesthesia Postprocedure Evaluation (Signed)
Anesthesia Post Note  Patient: Jaclyn Mcknight  Procedure(s) Performed: EXCISION RIGHT BREAST NIPPLE MASS (Right Breast)  Patient location during evaluation: PACU Anesthesia Type: General Level of consciousness: awake and alert Pain management: pain level controlled Vital Signs Assessment: post-procedure vital signs reviewed and stable Respiratory status: spontaneous breathing, nonlabored ventilation, respiratory function stable and patient connected to nasal cannula oxygen Cardiovascular status: blood pressure returned to baseline and stable Postop Assessment: no apparent nausea or vomiting Anesthetic complications: no     Last Vitals:  Vitals:   12/21/17 1327 12/21/17 1350  BP: (!) 131/93 128/85  Pulse: (!) 58 69  Resp: 16 16  Temp: 36.4 C   SpO2: 100% 100%    Last Pain:  Vitals:   12/21/17 1327  TempSrc: Temporal  PainSc: 3                  Cleda MccreedyJoseph K Piscitello

## 2017-12-21 NOTE — Discharge Instructions (Signed)

## 2017-12-21 NOTE — Anesthesia Procedure Notes (Signed)
Procedure Name: LMA Insertion Date/Time: 12/21/2017 12:05 PM Performed by: Clyde Lundborgisser, Szymon Foiles L, CRNA Pre-anesthesia Checklist: Patient identified, Emergency Drugs available, Suction available and Patient being monitored Patient Re-evaluated:Patient Re-evaluated prior to induction Oxygen Delivery Method: Circle system utilized Preoxygenation: Pre-oxygenation with 100% oxygen Induction Type: IV induction Ventilation: Mask ventilation without difficulty LMA: LMA inserted LMA Size: 3.5 Number of attempts: 1 Placement Confirmation: positive ETCO2,  breath sounds checked- equal and bilateral and CO2 detector Tube secured with: Tape Dental Injury: Teeth and Oropharynx as per pre-operative assessment

## 2017-12-21 NOTE — Anesthesia Post-op Follow-up Note (Signed)
Anesthesia QCDR form completed.        

## 2017-12-22 LAB — SURGICAL PATHOLOGY

## 2017-12-23 ENCOUNTER — Ambulatory Visit: Payer: 59 | Admitting: Nurse Practitioner

## 2017-12-27 ENCOUNTER — Ambulatory Visit (INDEPENDENT_AMBULATORY_CARE_PROVIDER_SITE_OTHER): Payer: 59 | Admitting: Nurse Practitioner

## 2017-12-27 ENCOUNTER — Encounter: Payer: Self-pay | Admitting: Nurse Practitioner

## 2017-12-27 ENCOUNTER — Other Ambulatory Visit: Payer: Self-pay

## 2017-12-27 VITALS — BP 114/71 | HR 71 | Temp 98.2°F | Ht 66.0 in | Wt 159.0 lb

## 2017-12-27 DIAGNOSIS — I10 Essential (primary) hypertension: Secondary | ICD-10-CM

## 2017-12-27 DIAGNOSIS — Z4889 Encounter for other specified surgical aftercare: Secondary | ICD-10-CM | POA: Diagnosis not present

## 2017-12-27 NOTE — Assessment & Plan Note (Signed)
Well controlled hypertension.  BP goal < 130/80.  Pt is working on lifestyle modifications for diet, no current exercise regimen (limited by surgical recovery).  Taking medications tolerating well without side effects. No current complications.  Patient also notes significant improvement in stress is also likely helping BP control.  Plan: 1. Continue taking lisinopril-HCTZ 20-25 mg once daily.  Consider dose reduction in future.  Patient currently asymptomatic and not experiencing side effects.  Continue at current dose. 2. Last labs and kidney function normal in last 2 weeks.  3. Encouraged heart healthy diet and increasing exercise to 30 minutes most days of the week once cleared by surgery. 4. Check BP 1-2 x per week at home, keep log, and bring to clinic at next appointment.  Use home BP meter for trending readings despite questionable DBP accuracy. 5. Follow up 6 months.

## 2017-12-27 NOTE — Progress Notes (Signed)
Subjective:    Patient ID: Jaclyn Mcknight, female    DOB: 02/10/1971, 47 y.o.   MRN: [id number]  Jaclyn Groomngelita N Seelinger is a [age] y.o. female presenting on 12/27/2017 for Hypertension (still having elevated dystolic reading in the 90's)   HPI Hypertension - She is checking BP at home or outside of clinic.  Readings 120/80-130/90, but likely inaccuracy with meter. - Current medications: lisinopril-hydrochlorothiazide 20-25 mg once daily, tolerating well without side effects - She is not currently symptomatic.  Patient states she feels the best she has in a long time. - Pt denies headache, lightheadedness, dizziness, changes in vision, chest tightness/pressure, palpitations, leg swelling, sudden loss of speech or loss of consciousness. - She  reports no regular exercise routine. - Her diet is low in salt, low in fat, and moderate in carbohydrates.   Wound Check Patient is s/p lumpectomy 12/21/2017 by Dr. Everlene FarrierPabon.  Pathology has returned with benign results.  Patient has not yet had post-surgical followup, but is scheduled in the next 7 days.   Is using ibuprofen only for pain relief and Vicodin prn as ordered.  Has not required any Vicodin today.   Social History   Tobacco Use  . Smoking status: Never Smoker  . Smokeless tobacco: Never Used  Substance Use Topics  . Alcohol use: Yes    Alcohol/week: 0.6 oz    Types: 1 Glasses of wine per week    Comment: 2 glasses wine per month.  . Drug use: Yes    Types: Marijuana    Comment: OCC    Review of Systems Per HPI unless specifically indicated above     Objective:    BP 114/71 (BP Location: Right Arm, Patient Position: Sitting, Cuff Size: Normal)   Pulse 71   Temp 98.2 F (36.8 C) (Oral)   Ht 5\' 6"  (1.676 m)   Wt 159 lb (72.1 kg)   BMI 25.66 kg/m   Wt Readings from Last 3 Encounters:  12/27/17 159 lb (72.1 kg)  12/21/17 158 lb 2 oz (71.7 kg)  12/06/17 161 lb (73 kg)    Physical Exam  Constitutional: She is oriented to  person, place, and time. She appears well-developed and well-nourished. No distress.  HENT:  Head: Normocephalic and atraumatic.  Neck: Normal range of motion. Neck supple.  Cardiovascular: Normal rate, regular rhythm, S1 normal, S2 normal, normal heart sounds and intact distal pulses.  Pulmonary/Chest: Effort normal and breath sounds normal. No respiratory distress.  Peri-incisonal ecchymosis, edema, tenderness.  Soft breast tissue without hematoma.  Incision intact without drainage.  Dermabond partially intact.  Nipple with scant amount dried bloody discharge.    Musculoskeletal: She exhibits no edema (pedal).  Neurological: She is alert and oriented to person, place, and time.  Skin: Skin is warm and dry.  Psychiatric: She has a normal mood and affect. Her behavior is normal.  Vitals reviewed.   Results for orders placed or performed during the hospital encounter of 12/21/17  Urine Drug Screen, Qualitative ([facility] only)  Result Value Ref Range   Tricyclic, Ur Screen NONE DETECTED NONE DETECTED   Amphetamines, Ur Screen NONE DETECTED NONE DETECTED   MDMA (Ecstasy)Ur Screen NONE DETECTED NONE DETECTED   Cocaine Metabolite,Ur [place] NONE DETECTED NONE DETECTED   Opiate, Ur Screen NONE DETECTED NONE DETECTED   Phencyclidine (PCP) Ur S NONE DETECTED NONE DETECTED   Cannabinoid 50 Ng, Ur [place] NONE DETECTED NONE DETECTED   Barbiturates, Ur Screen NONE DETECTED NONE DETECTED  Benzodiazepine, Ur Scrn NONE DETECTED NONE DETECTED   Methadone Scn, Ur NONE DETECTED NONE DETECTED  Pregnancy, urine POC  Result Value Ref Range   Preg Test, Ur NEGATIVE NEGATIVE  Surgical pathology  Result Value Ref Range   SURGICAL PATHOLOGY      Surgical Pathology CASE: [id number] PATIENT: Raeya Strole Surgical Pathology Report     SPECIMEN SUBMITTED: A. Breast mass right  CLINICAL HISTORY: None provided  PRE-OPERATIVE DIAGNOSIS: Right breast mass  POST-OPERATIVE DIAGNOSIS: Same as pre  op     DIAGNOSIS: A.  RIGHT BREAST MASS; RIGHT BREAST CENTRAL DUCT EXCISION: - DILATED DUCTS WITH DUCT ECTASIA. - CAUTERY ARTIFACT PARTIALLY LIMITS INTERPRETATION. - NEGATIVE FOR ATYPIA AND MALIGNANCY.   GROSS DESCRIPTION: A. Labeled: Right breast mass Received: In formalin Accompanying specimen radiograph: No Radiographic findings: Not applicable Time in fixative: According requisition 12:25 PM on 12/21/2017  Cold ischemic time: Time not provided Total fixation time: 8 hours Type of procedure: Lumpectomy Location / laterality of specimen: Right Orientation of specimen: Cranial, lateral and deep metallic markers Inking: Superior = blue Inferior = green Medial = yellow Lateral = orange Posteri or = black Anterior/Superficial = red Size of specimen: 3.5 (medial-lateral by 2.4 (superior-inferior) by 2.1 (anterior from posterior) centimeter Skin: None grossly identified Biopsy site: None grossly identified Number of discrete masses: No mass grossly identified, on the most superficial aspect there is purple fibrous tissue, no definable masses Description of remainder of tissue: Yellow lobulated focally fibrous  Block summary: 1-7 - entire specimen from medial towards lateral respectively and consecutively    Final Diagnosis performed by Glenice Bow, MD.   Electronically signed 12/22/2017 4:31:06PM The electronic signature indicates that the named Attending Pathologist has evaluated the specimen  Technical component performed at [facility], [address] [place], [place] [address] Lab: [phone] Dir: Jolene Schimke, MD, MMM  Professional component performed at [facility], [facility], [address] [place], [place], [place] [address] Lab: [phone] Dir: Georgiann Cocker. Oneita Kras, MD       Assessment & Plan:   Problem List Items Addressed This Visit      Cardiovascular and Mediastinum   Essential (primary) hypertension - Primary    Well controlled hypertension.   BP goal < 130/80.  Pt is working on lifestyle modifications for diet, no current exercise regimen (limited by surgical recovery).  Taking medications tolerating well without side effects. No current complications.  Patient also notes significant improvement in stress is also likely helping BP control.  Plan: 1. Continue taking lisinopril-HCTZ 20-25 mg once daily.  Consider dose reduction in future.  Patient currently asymptomatic and not experiencing side effects.  Continue at current dose. 2. Last labs and kidney function normal in last 2 weeks.  3. Encouraged heart healthy diet and increasing exercise to 30 minutes most days of the week once cleared by surgery. 4. Check BP 1-2 x per week at home, keep log, and bring to clinic at next appointment.  Use home BP meter for trending readings despite questionable DBP accuracy. 5. Follow up 6 months.         Other Visit Diagnoses    Encounter for post surgical wound check       Well healing wound.  No current wound complications. Maintain post-surgical follwoup with Dr. Everlene Farrier as scheduled.      Follow up plan: Return in about 6 months (around 06/29/2018) for hypertension.  Wilhelmina Mcardle, DNP, AGPCNP-BC Adult Gerontology Primary Care Nurse Practitioner [facility] 12/27/2017, 9:32 PM

## 2017-12-27 NOTE — Patient Instructions (Addendum)
Jaclyn GroomAngelita N Droge,   Thank you for coming in to clinic today.  1. Continue blood pressure medications without changes.  2. Keep eating your healthy, balanced diet.  Please schedule a follow-up appointment with Wilhelmina McardleLauren Demitria Hay, AGNP. Return in about 6 months (around 06/29/2018) for hypertension.  If you have any other questions or concerns, please feel free to call the clinic or send a message through MyChart. You may also schedule an earlier appointment if necessary.  You will receive a survey after today's visit either digitally by e-mail or paper by [employer] mail. Your experiences and feedback matter to us.  Please respond so we know how we are doing as we provide care for you.   Wilhelmina McardleLauren Julis Haubner, DNP, AGNP-BC Adult Gerontology Nurse Practitioner [facility], [facility]

## 2018-01-04 ENCOUNTER — Ambulatory Visit (INDEPENDENT_AMBULATORY_CARE_PROVIDER_SITE_OTHER): Payer: 59 | Admitting: Surgery

## 2018-01-04 ENCOUNTER — Encounter: Payer: Self-pay | Admitting: Surgery

## 2018-01-04 VITALS — BP 124/85 | HR 75 | Temp 98.2°F | Ht 66.0 in | Wt 161.0 lb

## 2018-01-04 DIAGNOSIS — N649 Disorder of breast, unspecified: Secondary | ICD-10-CM

## 2018-01-04 NOTE — Patient Instructions (Addendum)
Please call our office if you have any questions or concerns. The patient has been asked to return to the office in six months with a bilateral diagnostic mammogram and ultrasound.

## 2018-01-04 NOTE — Progress Notes (Signed)
S/p central duct excision 7/24 Doing very well No pain Path d/w pt in detail  PE NAD Wound healing well, no infection, seroma or abscess  A/p Doing well RTC 6 months w mammo and U/S

## 2018-01-28 ENCOUNTER — Encounter: Payer: Self-pay | Admitting: Nurse Practitioner

## 2018-06-01 ENCOUNTER — Other Ambulatory Visit: Payer: Self-pay

## 2018-06-01 DIAGNOSIS — N649 Disorder of breast, unspecified: Secondary | ICD-10-CM

## 2018-06-30 ENCOUNTER — Ambulatory Visit: Payer: 59 | Admitting: Nurse Practitioner

## 2018-07-03 ENCOUNTER — Other Ambulatory Visit: Payer: PRIVATE HEALTH INSURANCE

## 2018-07-10 ENCOUNTER — Ambulatory Visit: Payer: 59 | Admitting: Surgery

## 2018-07-10 ENCOUNTER — Other Ambulatory Visit: Payer: PRIVATE HEALTH INSURANCE

## 2018-07-17 ENCOUNTER — Ambulatory Visit: Payer: 59 | Admitting: Surgery

## 2018-12-15 ENCOUNTER — Encounter: Payer: Self-pay | Admitting: *Deleted

## 2019-01-15 ENCOUNTER — Other Ambulatory Visit: Payer: Self-pay | Admitting: Nurse Practitioner

## 2019-01-15 DIAGNOSIS — I10 Essential (primary) hypertension: Secondary | ICD-10-CM

## 2019-02-01 ENCOUNTER — Other Ambulatory Visit: Payer: Self-pay

## 2019-02-01 DIAGNOSIS — N63 Unspecified lump in unspecified breast: Secondary | ICD-10-CM

## 2019-03-06 ENCOUNTER — Other Ambulatory Visit: Payer: Self-pay | Admitting: Nurse Practitioner

## 2019-03-06 DIAGNOSIS — I10 Essential (primary) hypertension: Secondary | ICD-10-CM

## 2019-03-08 ENCOUNTER — Ambulatory Visit (INDEPENDENT_AMBULATORY_CARE_PROVIDER_SITE_OTHER): Payer: 59 | Admitting: Nurse Practitioner

## 2019-03-08 ENCOUNTER — Encounter: Payer: Self-pay | Admitting: Nurse Practitioner

## 2019-03-08 ENCOUNTER — Other Ambulatory Visit: Payer: Self-pay

## 2019-03-08 VITALS — Temp 97.5°F

## 2019-03-08 DIAGNOSIS — I10 Essential (primary) hypertension: Secondary | ICD-10-CM | POA: Diagnosis not present

## 2019-03-08 MED ORDER — LISINOPRIL-HYDROCHLOROTHIAZIDE 20-25 MG PO TABS: 1.0000 | ORAL_TABLET | Freq: Every day | ORAL | 1 refills | Status: DC

## 2019-03-08 NOTE — Progress Notes (Signed)
   Telemedicine Encounter: Disclosed to patient at start of encounter that we will provide appropriate telemedicine services.  Patient consents to be treated via phone prior to discussion. - Patient is at her home and is accessed via telephone. - Services are provided by Cassell Smiles from [facility].  Subjective:    Patient ID: Jaclyn Mcknight, female    DOB: 1971-03-16, 48 y.o.   MRN: [id number]  Jaclyn Mcknight is a 48 y.o. female presenting on 03/08/2019 for Medication Refill and Hypertension  HPI Hypertension - She is not checking BP at home or outside of clinic.  She is feeling really great.  She does have access to BP machine at work, but is not checking. - Current medications: lisinopril-HCTZ 20-25 mg one tab daily, tolerating well without side effects - She is not currently symptomatic. - Pt denies headache, lightheadedness, dizziness, changes in vision, chest tightness/pressure, palpitations, leg swelling, sudden loss of speech or loss of consciousness. - She is very active at work.  Social History   Tobacco Use  . Smoking status: Never Smoker  . Smokeless tobacco: Never Used  Substance Use Topics  . Alcohol use: Yes    Alcohol/week: 1.0 standard drinks    Types: 1 Glasses of wine per week    Comment: 2 glasses wine per month.  . Drug use: Yes    Types: Marijuana    Comment: OCC    Review of Systems Per HPI unless specifically indicated above     Objective:    Temp (!) 97.5 F (36.4 C) (Oral)   Wt Readings from Last 3 Encounters:  01/04/18 161 lb (73 kg)  12/27/17 159 lb (72.1 kg)  12/21/17 158 lb 2 oz (71.7 kg)    Physical Exam Patient remotely monitored.  Verbal communication appropriate.  Cognition normal.   No recent labs.    Assessment & Plan:   Problem List Items Addressed This Visit      Cardiovascular and Mediastinum   Essential (primary) hypertension - Primary   Relevant Orders   BASIC METABOLIC PANEL WITH GFR     Status unknown.  BP goal < 130/80.  Pt is maintaining lifestyle modifications.  Taking medications tolerating well without side effects. No known complications.  Plan: 1. Continue taking lisinopril-hctz without change 2. Obtain labs this week  3. Encouraged heart healthy diet and increasing exercise to 30 minutes most days of the week. 4. Check BP 1-2 x per week at home, keep log, and bring to clinic at next appointment.  Send 1-2 readings from next week via MyChart for dose adjustments 5. Follow up 6 months.     Meds ordered this encounter  Medications  . lisinopril-hydrochlorothiazide (ZESTORETIC) 20-25 MG tablet    Sig: Take 1 tablet by mouth daily.    Dispense:  90 tablet    Refill:  1    Order Specific Question:   Supervising Provider    Answer:   Olin Hauser [[id number]]   - Time spent in direct consultation with patient via telemedicine about above concerns: 5 minutes  Follow up plan: Return in about 6 months (around 09/06/2019) for hypertension AND labs this week.  Cassell Smiles, DNP, AGPCNP-BC Adult Gerontology Primary Care Nurse Practitioner [facility] 03/08/2019, 11:15 AM

## 2019-05-07 ENCOUNTER — Ambulatory Visit (INDEPENDENT_AMBULATORY_CARE_PROVIDER_SITE_OTHER): Payer: Self-pay | Admitting: Family Medicine

## 2019-05-07 ENCOUNTER — Other Ambulatory Visit: Payer: Self-pay

## 2019-05-07 ENCOUNTER — Encounter: Payer: Self-pay | Admitting: Family Medicine

## 2019-05-07 DIAGNOSIS — G43009 Migraine without aura, not intractable, without status migrainosus: Secondary | ICD-10-CM

## 2019-05-07 MED ORDER — SUMATRIPTAN SUCCINATE 50 MG PO TABS: 25.0000 mg | ORAL_TABLET | Freq: Once | ORAL | 2 refills | Status: DC | PRN

## 2019-05-07 MED ORDER — VENLAFAXINE HCL ER 37.5 MG PO CP24: 37.5000 mg | ORAL_CAPSULE | Freq: Every day | ORAL | 2 refills | Status: DC

## 2019-05-07 NOTE — Patient Instructions (Addendum)
1. You most likely have Chronic Migraine Headaches - Migraine headaches present differently for many patients, pain is usually throbbing or aching, often on one side of head or behind the eye. They tend to last for up to hours or days. In treating migraines, our goal is to 1) stop the headache and 2) prevent recurrence of headaches  Treatment to STOP the headache at this time: - Start with Sumatriptan HALF for 25 or WHOLE for 50mg  - take 1 immediately at onset of moderate to severe migraine headache, if unresolved or return within 2 hours then repeat dose 1 tablet, that is max dose for 24 hours. (Note - this medication can cause a brief episode of flushing and chest pressure or pain very soon after taking it. That is NORMAL, and it is the medicine taking effect and dilating some blood vessels. It should pass, and resolve within seconds to minutes after - it may not happen at all)  - Try this for 1-2 weeks, if absolutely NOT helping then contact me to discuss changes, possibly can double sumatriptan dose or try nasal spray - You can still take over the counter meds with Ibuprofen up to 600-800mg  per dose 3 times a day with food for a few days and may try Excedrin Migraine as needed, ONLY use these after you have tried Sumatriptan up to 2 doses, and try to limit their use if they are not effective  Treatment to PREVENT headaches: - Goal is to avoid triggers. We need to learn more details on what are your exact or possible headache triggers first. - Keep detailed headache diary (on printed handout) for possible triggers, bring this to your next visit to discuss further - Known possible triggers include caffeine, chocolate, alcohol, stress, weather changes, menstrual cycle, certain other foods - Also be aware that OTC pain meds/anti-inflammatories can cause rebound headache, they help resolve the headache but then after the effect wears off they can CAUSE a headache. Try to taper down and stop these  medications and allow them to get out of your system for 1-2 weeks   ALSO START Effexor 37.5mg  once daily to help prevent migraines in future, it should help anxiety as well. Take every day  Future medication options that can be considered if persistent severe migraines and if failed some of the above prevention medicines include: - Aimovig, Emgality, Trulicity   (these are once a month self injection medications that work directly to block migraine signals. They are relatively new, can be costly and have been proven to be very safe and effective. We can try to get them approved or for low cost if needed in future.)   Please schedule a Follow-up Appointment to: Return in about 3 months (around 08/05/2019) for 3 month migraine follow-up w/ new provider.  If you have any other questions or concerns, please feel free to call the office or send a message through [place]. You may also schedule an earlier appointment if necessary.  Additionally, you may be receiving a survey about your experience at our office within a few days to 1 week by e-mail or mail. We value your feedback.  Nobie Putnam, DO [place]

## 2019-05-07 NOTE — Progress Notes (Signed)
Virtual Visit via Telephone The purpose of this virtual visit is to provide medical care while limiting exposure to the novel coronavirus (COVID19) for both patient and office staff.  Consent was obtained for phone visit:  Yes.   Answered questions that patient had about telehealth interaction:  Yes.   I discussed the limitations, risks, security and privacy concerns of performing an evaluation and management service by telephone. I also discussed with the patient that there may be a patient responsible charge related to this service. The patient expressed understanding and agreed to proceed.  Patient Location: Home Provider Location: [facility])  ---------------------------------------------------------------------- Chief Complaint  Patient presents with  . Migraine    month    S: Reviewed CMA documentation. I have called patient and gathered additional HPI as follows:  Previous PCP Cassell Smiles, AGPCNP-BC   Chronic Migraine headaches Reports concern with migraine headaches increasing in frequency and intensity. Last migraine headache, she did get aura first and aware that it is developing or starting. She often will take medicine prior to onset to help ease it, but still usually gets headaches. Describes last migraine onset 1 week ago on Monday, headache duration lasted 8 hours with treatment. Usually takes Tylenol 500mg  x 2 and BC powder with some relief. - Today no headache, she is doing well but concerned about frequency of migraines. - Works in nursing facility, she admits some increased stress lately. She cannot have sedating medicines because of work. - Also reviews past history of meningioma s/p treatment was followed by [facility] in past and had seizure as result of that. She did not follow with neurology for migraines long term. She has had prior treatment in past with Sumatriptan and also believes trial on Topamax. Has not taken anti depressant or  anxiety med in past regularly.   Denies any high risk travel to areas of current concern for COVID19. Denies any known or suspected exposure to person with or possibly with COVID19.  Denies any fevers, chills, sweats, body ache, cough, shortness of breath, sinus pain or pressure, abdominal pain, diarrhea  Past Medical History:  Diagnosis Date  . Anxiety   . Back pain at L4-L5 level   . Gravida 3 para 3   . Hypertension   . Migraines    MIGRAINES  . Seizures ([place])    X1 IN 2016   Social History   Tobacco Use  . Smoking status: Never Smoker  . Smokeless tobacco: Never Used  Substance Use Topics  . Alcohol use: Yes    Alcohol/week: 1.0 standard drinks    Types: 1 Glasses of wine per week    Comment: 2 glasses wine per month.  . Drug use: Yes    Types: Marijuana    Comment: OCC    Current Outpatient Medications:  .  ibuprofen (ADVIL,MOTRIN) 200 MG tablet, Take 800 mg by mouth every 8 (eight) hours as needed (for pain.)., Disp: , Rfl:  .  lisinopril-hydrochlorothiazide (ZESTORETIC) 20-25 MG tablet, Take 1 tablet by mouth daily., Disp: 90 tablet, Rfl: 1 .  SUMAtriptan (IMITREX) 50 MG tablet, Take 0.5-1 tablets (25-50 mg total) by mouth once as needed for up to 1 dose for migraine. May repeat one dose in 2 hours if headache persists, for max dose 24 hours, Disp: 12 tablet, Rfl: 2 .  venlafaxine XR (EFFEXOR XR) 37.5 MG 24 hr capsule, Take 1 capsule (37.5 mg total) by mouth daily with breakfast., Disp: 30 capsule, Rfl: 2  Depression  screen [facility] 2/9 05/07/2019 03/08/2019  Decreased Interest 0 0  Down, Depressed, Hopeless 0 0  PHQ - 2 Score 0 0  Altered sleeping 0 3  Tired, decreased energy 0 1  Change in appetite 0 0  Feeling bad or failure about yourself  0 0  Trouble concentrating 0 0  Moving slowly or fidgety/restless 0 0  Suicidal thoughts 0 0  PHQ-9 Score 0 4  Difficult doing work/chores Not difficult at all Not difficult at all    No flowsheet data  found.  -------------------------------------------------------------------------- O: No physical exam performed due to remote telephone encounter.  Lab results reviewed.  No results found for this or any previous visit (from the past 2160 hour(s)).  -------------------------------------------------------------------------- A&P:  Problem List Items Addressed This Visit    Migraine without aura and responsive to treatment - Primary   Relevant Medications   SUMAtriptan (IMITREX) 50 MG tablet   venlafaxine XR (EFFEXOR XR) 37.5 MG 24 hr capsule     Consistent with chronic recurrent migraines with aura, seems to be several migraines per month, can be severe at times, limiting her function, with treatment duration is 24 hours or less usually - Triggers with stress, also alcohol intake can cause it - Currently without active HA or reported neuro deficits, tolerating PO w/o n/v  Plan: Start abortive therapy with Sumatriptan 50mg  tabs - take half to 1 PRN (#12, 0 refill due to quantity limit), severe HA, may repeat dose within 2 hr if persistent, no more in 24 hours, in future can titrate dose to 50-100mg  if needed. Counseling on potential side effect / intolerance with chest discomfort acutely after taking sumatriptan  Recommend that she can continue current OTC NSAID / Acetaminophen / BC treatment PRN for mild to moderate headaches still as first line.  ADD Venlafaxine XR (Effexor) anti depressant SNRI 37.5mg  daily for migraine prophylaxis  Avoid triggers including foods, caffeine. Important to rest. Return criteria given for acute migraine, when to go to office vs ED  Defer referral to Neuro as this time, can reconsider at future visit. Also can titrate Effexor dose, or consider other prophylaxis agents.   Meds ordered this encounter  Medications  . SUMAtriptan (IMITREX) 50 MG tablet    Sig: Take 0.5-1 tablets (25-50 mg total) by mouth once as needed for up to 1 dose for migraine.  May repeat one dose in 2 hours if headache persists, for max dose 24 hours    Dispense:  12 tablet    Refill:  2  . venlafaxine XR (EFFEXOR XR) 37.5 MG 24 hr capsule    Sig: Take 1 capsule (37.5 mg total) by mouth daily with breakfast.    Dispense:  30 capsule    Refill:  2    Follow-up: - Return in 3 month migraine follow-up w/ new provider  Patient verbalizes understanding with the above medical recommendations including the limitation of remote medical advice.  Specific follow-up and call-back criteria were given for patient to follow-up or seek medical care more urgently if needed.   - Time spent in direct consultation with patient on phone: 8 minutes   , DO [facility] 05/07/2019, 1:45 PM

## 2019-08-06 ENCOUNTER — Other Ambulatory Visit: Payer: Self-pay

## 2019-08-06 ENCOUNTER — Ambulatory Visit (INDEPENDENT_AMBULATORY_CARE_PROVIDER_SITE_OTHER): Payer: Commercial Managed Care - PPO

## 2019-08-06 ENCOUNTER — Encounter (HOSPITAL_COMMUNITY): Payer: Self-pay | Admitting: Emergency Medicine

## 2019-08-06 ENCOUNTER — Ambulatory Visit (HOSPITAL_COMMUNITY)
Admission: EM | Admit: 2019-08-06 | Discharge: 2019-08-06 | Disposition: A | Discharge status: Home / Self Care | Payer: Commercial Managed Care - PPO | Attending: Family Medicine | Admitting: Family Medicine

## 2019-08-06 DIAGNOSIS — W208XXA Other cause of strike by thrown, projected or falling object, initial encounter: Secondary | ICD-10-CM

## 2019-08-06 DIAGNOSIS — M79672 Pain in left foot: Secondary | ICD-10-CM

## 2019-08-06 DIAGNOSIS — S92325A Nondisplaced fracture of second metatarsal bone, left foot, initial encounter for closed fracture: Secondary | ICD-10-CM

## 2019-08-06 NOTE — Discharge Instructions (Addendum)
Wear fracture boot when up Wear for the first 2 weeks, then may wean out of For follow up problems call your PCP or the orthopedic listed Use ice, elevation, ibuprofen fo rpain

## 2019-08-06 NOTE — ED Provider Notes (Signed)
[place]    CSN: [id number] Arrival date & time: 08/06/19  1108      History   Chief Complaint Chief Complaint  Patient presents with  . Foot Injury    HPI Jaclyn Mcknight is a 49 y.o. female.   HPI  Patient is here for foot pain.  She states she dropped heavy box on her left foot yesterday.  Bruised and painful.  She also, unfortunately, caught her foot underneath a wooden bed frame.  The foot is very painful.  Pain with weightbearing. She is in good health.  She has good bone health as far she knows.  Past Medical History:  Diagnosis Date  . Anxiety   . Back pain at L4-L5 level   . Gravida 3 para 3   . Hypertension   . Migraines    MIGRAINES  . Seizures ([place])    X1 IN 2016    Patient Active Problem List   Diagnosis Date Noted  . Nipple lesion   . Screening for gout 07/14/2016  . Seizure disorder ([place]) 09/19/2014  . Essential (primary) hypertension 09/19/2014  . Migraine without aura and responsive to treatment 09/19/2014  . 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 Atypical chest pain 09/19/2014  . Hypoglycemia 09/19/2014    Past Surgical History:  Procedure Laterality Date  . 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  . BREAST LUMPECTOMY Right 12/21/2017   Procedure: EXCISION RIGHT BREAST NIPPLE MASS;  Surgeon: Jules Husbands, MD;  Location: [facility] ORS;  Service: General;  Laterality: Right;  . TUBAL LIGATION      OB History   No obstetric history on file.      Home Medications    Prior to Admission medications   Medication Sig Start Date End Date Taking? Authorizing Provider  lisinopril-hydrochlorothiazide (ZESTORETIC) 20-25 MG tablet Take 1 tablet by mouth daily. 03/08/19  Yes Mikey College, NP  venlafaxine XR (EFFEXOR XR) 37.5 MG 24 hr capsule Take 1 capsule (37.5 mg total) by mouth daily with breakfast. 05/07/19  Yes Karamalegos, Devonne Doughty, DO  ibuprofen (ADVIL,MOTRIN) 200 MG tablet Take 800 mg  by mouth every 8 (eight) hours as needed (for pain.).    [provider]  SUMAtriptan (IMITREX) 50 MG tablet Take 0.5-1 tablets (25-50 mg total) by mouth once as needed for up to 1 dose for migraine. May repeat one dose in 2 hours if headache persists, for max dose 24 hours 05/07/19   Karamalegos, Devonne Doughty, DO    Family History Family History  Problem Relation Age of Onset  . Hypertension Mother   . Diabetes Mother   . Hypertension Sister   . Hypertension Maternal Aunt   . Diabetes Maternal Aunt   . Hypertension Maternal Grandmother   . Diabetes Maternal Grandmother     Social History Social History   Tobacco Use  . Smoking status: Never Smoker  . Smokeless tobacco: Never Used  Substance Use Topics  . Alcohol use: Yes    Alcohol/week: 1.0 standard drinks    Types: 1 Glasses of wine per week    Comment: 2 glasses wine per month.  . Drug use: Yes    Types: Marijuana    Comment: [place]     Allergies   Patient has no known allergies.   Review of Systems Review of Systems  Musculoskeletal: Positive for arthralgias and gait problem.     Physical Exam Triage Vital Signs ED Triage Vitals  Enc Vitals Group     BP 08/06/19 1209 (!) 161/106     Pulse Rate 08/06/19 1208 65     Resp 08/06/19 1208 16     Temp 08/06/19 1208 98.5 F (36.9 C)     Temp Source 08/06/19 1208 Oral     SpO2 08/06/19 1208 98 %     Weight --      Height --      Head Circumference --      Peak Flow --      Pain Score 08/06/19 1206 10     Pain Loc --      Pain Edu? --      Excl. in GC? --    No data found.  Updated Vital Signs BP (!) 156/108   Pulse 65   Temp 98.5 F (36.9 C) (Oral)   Resp 16   SpO2 98%      Physical Exam Vitals reviewed.  Constitutional:      General: She is not in acute distress.    Appearance: She is well-developed and normal weight.     Comments: Pleasant.  HENT:     Head: Normocephalic and atraumatic.     Mouth/Throat:     Comments: Mask in  place Eyes:     Conjunctiva/sclera: Conjunctivae normal.     Pupils: Pupils are equal, round, and reactive to light.  Cardiovascular:     Rate and Rhythm: Normal rate.  Pulmonary:     Effort: Pulmonary effort is normal. No respiratory distress.  Musculoskeletal:        General: Normal range of motion.     Cervical back: Normal range of motion.       Feet:  Skin:    General: Skin is warm and dry.     Findings: Bruising present.  Neurological:     Mental Status: She is alert.     Gait: Gait abnormal.      UC Treatments / Results  Labs (all labs ordered are listed, but only abnormal results are displayed) Labs Reviewed - No data to display  EKG   Radiology IMPRESSION: Transverse lucency in the proximal second metatarsal region, concerning for nondisplaced fracture. No other findings suggesting potential fracture elsewhere. No dislocation. No evident arthropathy.  Electronically Signed   By: Bretta Bang III M.D.   On: 08/06/2019 12:38 Procedures Procedures (including critical care time)  Medications Ordered in UC Medications - No data to display  Initial Impression / Assessment and Plan / UC Course  I have reviewed the triage vital signs and the nursing notes.  Pertinent labs & imaging results that were available during my care of the patient were reviewed by me and considered in my medical decision making (see chart for details).     I showed the patient her x-rays.  There is a small defect in the area of her second proximal metatarsal that may be a fracture.  I will treat her accordingly.  Follow-up with her primary care doctor. Final Clinical Impressions(s) / UC Diagnoses   Final diagnoses:  Closed nondisplaced fracture of second metatarsal bone of left foot, initial encounter     Discharge Instructions     Wear fracture boot when up Wear for the first 2 weeks, then may wean out of For follow up problems call your PCP or the orthopedic  listed Use ice, elevation, ibuprofen fo rpain     ED Prescriptions    None     PDMP not reviewed  this encounter.   Eustace Moore, MD 08/09/19 [phone]

## 2019-08-06 NOTE — ED Triage Notes (Signed)
Yesterday PT dropped something on left foot. Today, she struck left foot on bed post. Area is bruised and swollen.

## 2019-08-22 ENCOUNTER — Ambulatory Visit: Payer: Self-pay

## 2019-08-22 ENCOUNTER — Other Ambulatory Visit: Payer: Self-pay

## 2019-08-22 ENCOUNTER — Encounter: Payer: Self-pay | Admitting: Orthopedic Surgery

## 2019-08-22 ENCOUNTER — Ambulatory Visit: Payer: Commercial Managed Care - PPO | Admitting: Orthopedic Surgery

## 2019-08-22 DIAGNOSIS — M25572 Pain in left ankle and joints of left foot: Secondary | ICD-10-CM

## 2019-08-23 ENCOUNTER — Encounter: Payer: Self-pay | Admitting: Orthopedic Surgery

## 2019-08-23 NOTE — Progress Notes (Signed)
Office Visit Note   Patient: Jaclyn Mcknight           Date of Birth: 1971-05-21           MRN: [id number] Visit Date: 08/22/2019 Requested by: No referring provider defined for this encounter. PCP: Galen Manila, NP (Inactive)  Subjective: No chief complaint on file.   HPI: Patient presents with left foot pain.  Date of injury 08/06/2019.  Box fell on her foot.  She also subsequently had an injury where her toes hit the edge of the bed.  She has been ambulating full weightbearing in a postop shoe.  Ports some swelling around the second third and fourth metatarsal heads.  Takes ibuprofen as needed.              ROS: All systems reviewed are negative as they relate to the chief complaint within the history of present illness.  Patient denies  fevers or chills.   Assessment & Plan: Visit Diagnoses:  1. Pain in left ankle and joints of left foot     Plan: Impression is left foot pain with some swelling around the metatarsal heads indicative of possible occult fracture.  Radiographs are not conclusive today for fracture.  Nonetheless I think her treatment in a hard soled shoe is indicated for another week to 10 days.  Then I would expect she should be able to get into regular shoes in early April.  Come back in 4 weeks if she is not walking in regular shoes by that time. Follow-Up Instructions: Return if symptoms worsen or fail to improve.   Orders:  Orders Placed This Encounter  Procedures  . XR Foot Complete Left   No orders of the defined types were placed in this encounter.     Procedures: No procedures performed   Clinical Data: No additional findings.  Objective: Vital Signs: There were no vitals taken for this visit.  Physical Exam:   Constitutional: Patient appears well-developed HEENT:  Head: Normocephalic Eyes:EOM are normal Neck: Normal range of motion Cardiovascular: Normal rate Pulmonary/chest: Effort normal Neurologic: Patient is  alert Skin: Skin is warm Psychiatric: Patient has normal mood and affect    Ortho Exam: Ortho exam demonstrates palpable pedal pulses the left foot some swelling around the metatarsal heads 2 3 and 4.  Only mild pain with passive toe flexion extension #3 but 1-4 and 5 not too painful.  Not much pain with pronation supination of the forefoot.  Palpable intact nontender anterior to posterior to peroneal and Achilles tendons.  Specialty Comments:  No specialty comments available.  Imaging: No results found.   PMFS History: Patient Active Problem List   Diagnosis Date Noted  . Nipple lesion   . Screening for gout 07/14/2016  . Seizure disorder ([facility]) 09/19/2014  . Essential (primary) hypertension 09/19/2014  . Migraine without aura and responsive to treatment 09/19/2014  . 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 Atypical chest pain 09/19/2014  . Hypoglycemia 09/19/2014   Past Medical History:  Diagnosis Date  . Anxiety   . Back pain at L4-L5 level   . Gravida 3 para 3   . Hypertension   . Migraines    MIGRAINES  . Seizures ([facility])    X1 IN 2016    Family History  Problem Relation Age of Onset  . Hypertension Mother   . Diabetes Mother   . Hypertension Sister   . Hypertension Maternal Aunt   . Diabetes Maternal Aunt   .  Hypertension Maternal Grandmother   . Diabetes Maternal Grandmother     Past Surgical History:  Procedure Laterality Date  . 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  . BREAST LUMPECTOMY Right 12/21/2017   Procedure: EXCISION RIGHT BREAST NIPPLE MASS;  Surgeon: Jules Husbands, MD;  Location: [facility] ORS;  Service: General;  Laterality: Right;  . TUBAL LIGATION     Social History   Occupational History  . Not on file  Tobacco Use  . Smoking status: Never Smoker  . Smokeless tobacco: Never Used  Substance and Sexual Activity  . Alcohol use: Yes    Alcohol/week: 1.0 standard drinks    Types: 1 Glasses of wine per week     Comment: 2 glasses wine per month.  . Drug use: Yes    Types: Marijuana    Comment: OCC  . Sexual activity: Yes    Comment: tubal ligation

## 2019-08-30 IMAGING — MG MM DIGITAL DIAGNOSTIC BILAT W/ TOMO W/ CAD
6 of 10 series · 6 of 30 positions shown · non-contrast
Comparison: Previous exam(s).

CLINICAL DATA: 46-year-old patient palpates a pea-sized lump in the
right breast, when lying supine and pressing downward in the central
aspect of the nipple. She denies any nipple discharge or change in
the appearance of the nipple.

EXAM:
DIGITAL DIAGNOSTIC BILATERAL MAMMOGRAM WITH CAD AND TOMO
ULTRASOUND RIGHT BREAST

[L CC synth-2D]
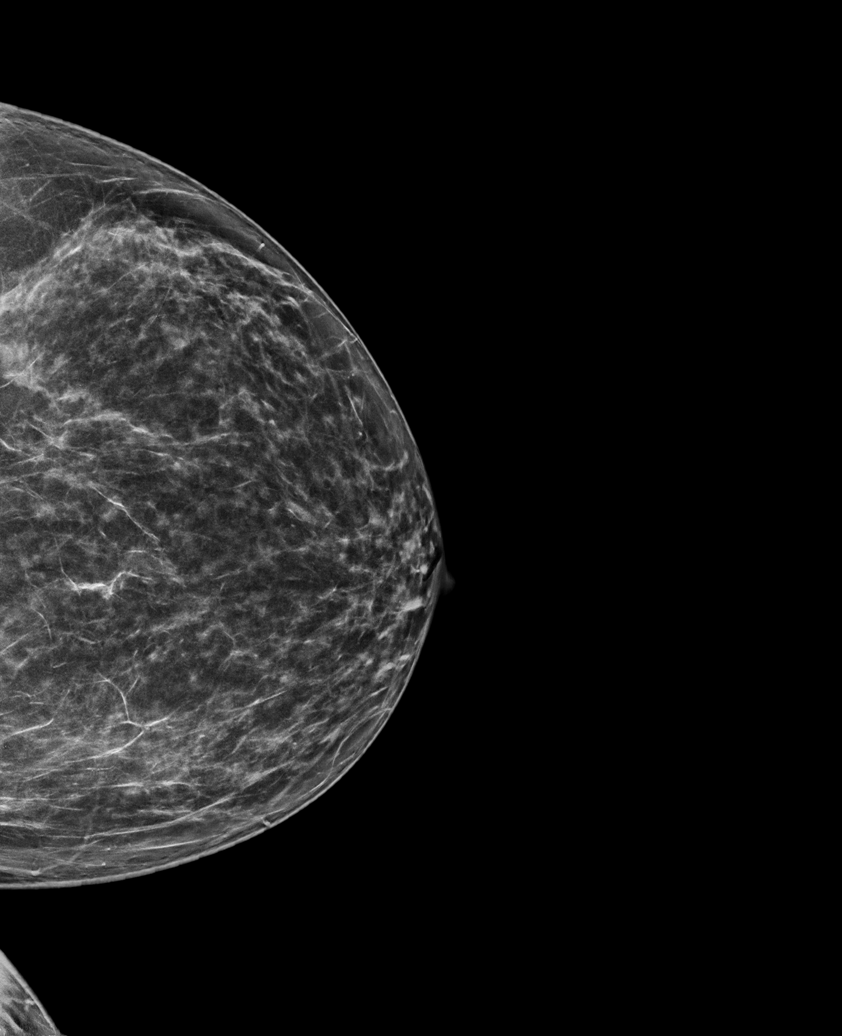

[R CC synth-2D]
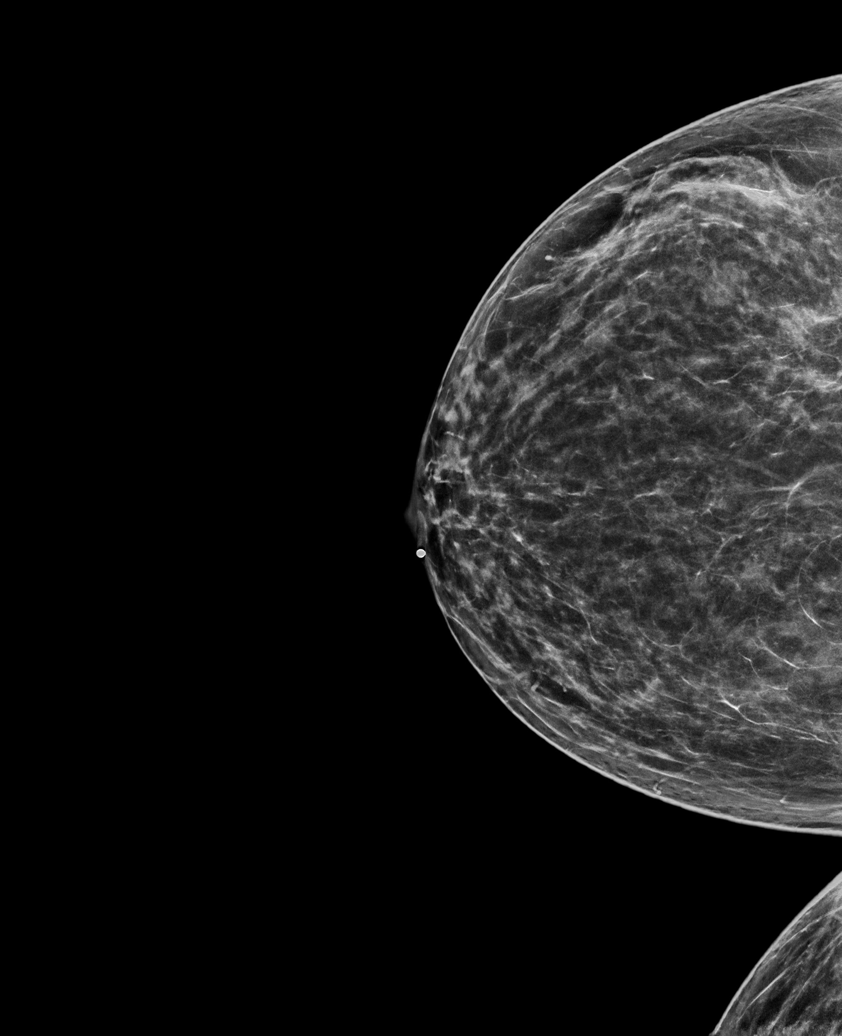

[R TAN synth-2D]
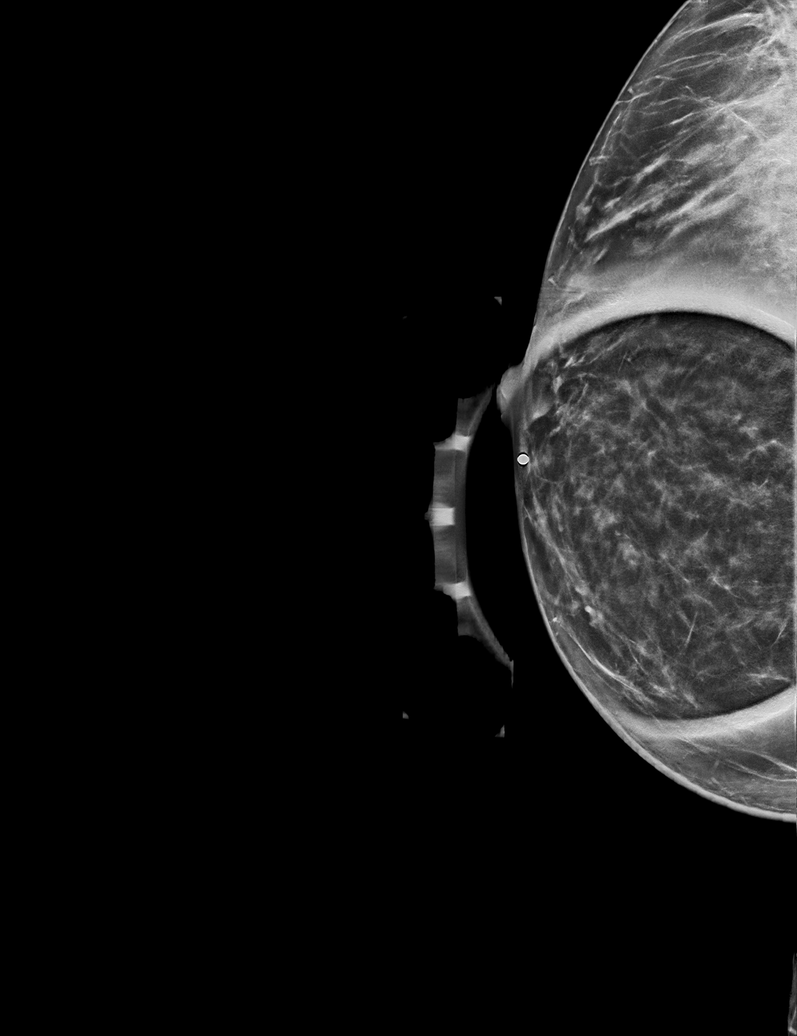

[R MLO synth-2D]
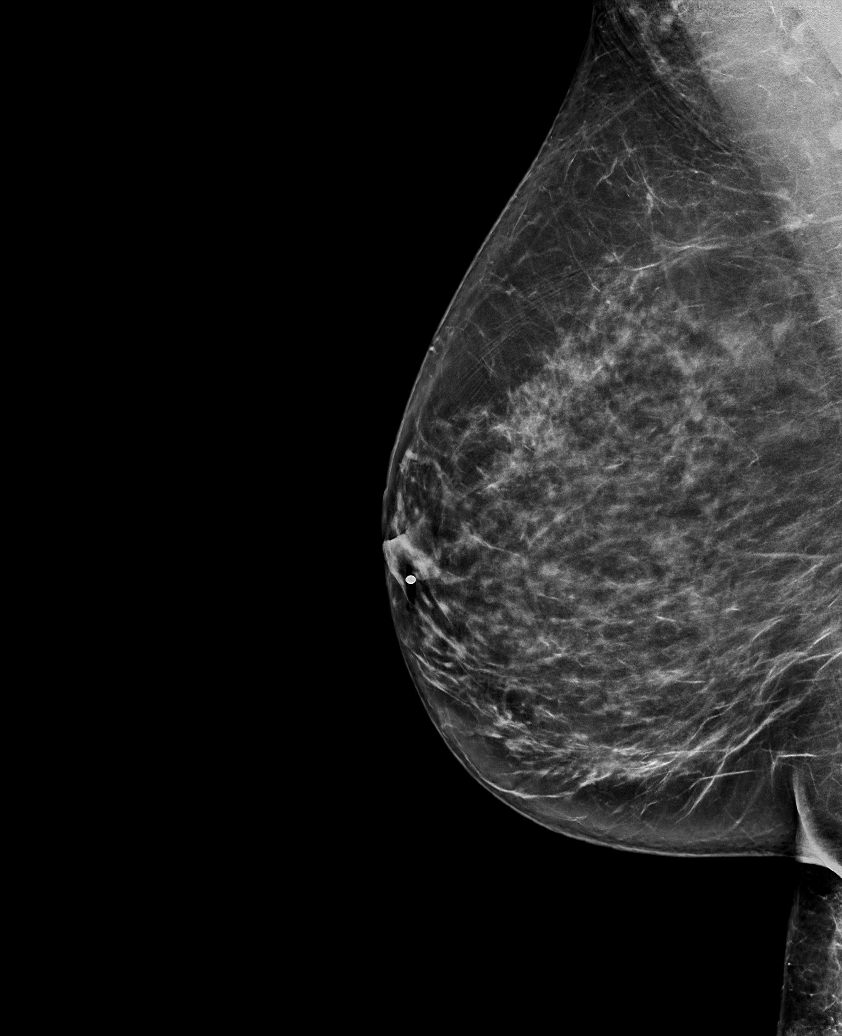

[L MLO synth-2D]
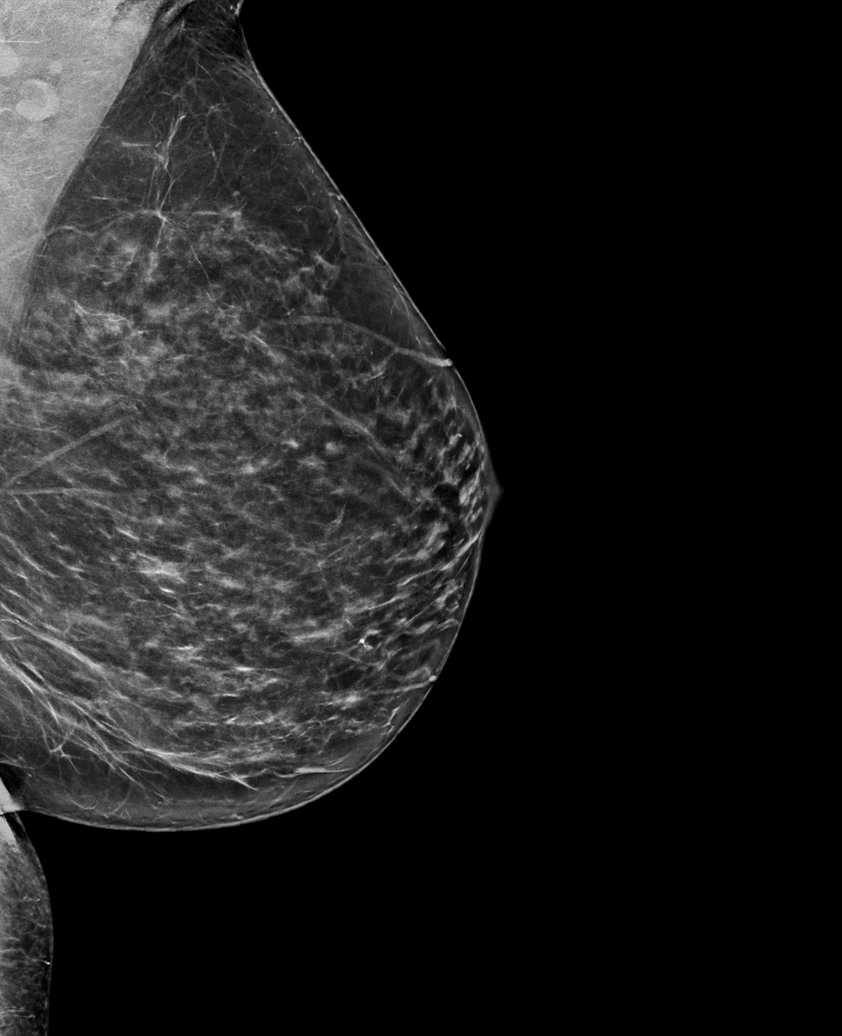

[R MLO tomo · tomo slice 39/77.0]
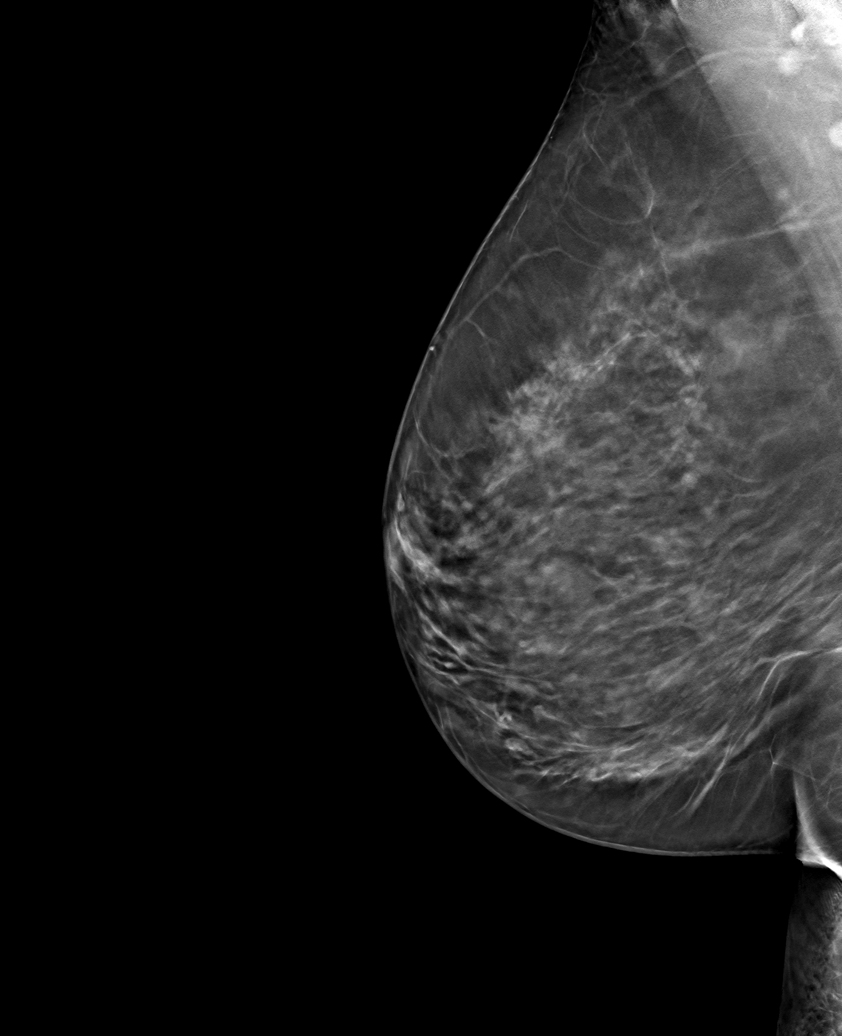

[6 of 30 positions shown; findings below may reference images not displayed]

ACR Breast Density Category b: There are scattered areas of
fibroglandular density.
FINDINGS: No mass, architectural distortion, or suspicious microcalcification
is identified in either breast to suggest malignancy. Spot
tangential view of the retroareolar right breast shows no breast
parenchymal mass.

Mammographic images were processed with CAD.

On physical exam, I palpate a discrete smooth firm lump associated
with the central aspect of the nipple. The skin of the nipple
areolar complex appears normal.

Targeted ultrasound is performed, showing a complex cystic mass
centered within the nipple. The mass measures 0.7 x 0.7 x 0.5 cm.
Some of the mass is anechoic, and other portions are hypoechoic.
There is vascular flow associated with the nipple, and some flow
associated with the mass. The ducts and breast parenchyma deep to
the nipple appear normal. Ultrasound of the right axilla is negative
for lymphadenopathy.
IMPRESSION: Palpable mass is shown on ultrasound to be centered within the right
nipple and measures 0.7 cm. The mass appears complex cystic, with
internal solid components not excluded. Papilloma or ductal
carcinoma in situ cannot be excluded.

No evidence of malignancy in the left breast.

RECOMMENDATION:
Given that the mass appears within the nipple, surgical consultation
for excision of the palpable mass is recommended.

I have discussed the findings and recommendations with the patient.
Results were also provided in writing at the conclusion of the
visit. If applicable, a reminder letter will be sent to the patient
regarding the next appointment.

BI-RADS CATEGORY  4: Suspicious.

## 2019-11-30 ENCOUNTER — Encounter: Payer: 59 | Admitting: Family Medicine

## 2019-11-30 ENCOUNTER — Encounter: Payer: Self-pay | Admitting: Family Medicine

## 2019-11-30 ENCOUNTER — Ambulatory Visit (INDEPENDENT_AMBULATORY_CARE_PROVIDER_SITE_OTHER): Payer: Commercial Managed Care - PPO | Admitting: Family Medicine

## 2019-11-30 VITALS — Temp 97.0°F | Ht 66.0 in | Wt 165.0 lb

## 2019-11-30 DIAGNOSIS — M5442 Lumbago with sciatica, left side: Secondary | ICD-10-CM | POA: Diagnosis not present

## 2019-11-30 DIAGNOSIS — M545 Low back pain, unspecified: Secondary | ICD-10-CM | POA: Insufficient documentation

## 2019-11-30 MED ORDER — CYCLOBENZAPRINE HCL 5 MG PO TABS: 5.0000 mg | ORAL_TABLET | Freq: Three times a day (TID) | ORAL | 1 refills | Status: DC | PRN

## 2019-11-30 MED ORDER — PREDNISONE 20 MG PO TABS: 40.0000 mg | ORAL_TABLET | Freq: Every day | ORAL | 0 refills | Status: AC

## 2019-11-30 NOTE — Patient Instructions (Signed)
Begin Prednisone 40mg  daily for the next 5 days.  Avoid all NSAIDs (ibuprofen, motrin, advil, aleve, BC powder, goody's) while taking the prednisone.  Start Flexeril 5mg  (muscle relaxant) - use half or whole tablet up to 3 times daily (may make you drowsy)  Increase Tylenol to 1000mg  up to 3 times daily for breakthrough pain for 3-5 days then only as needed  Use moist heat or heating pad on shoulders / neck, and have family member help with soft tissue massage as needed  Please schedule a follow-up appointment with me in 2 to 4 weeks if symptoms not improving, or sooner if worsening  If develop nausea / vomiting, loss of bowel/bladder control, worsening numbness, weakness or tingling - call 911 or go immediately to ED.             Low Back Pain Exercises  See other page with pictures of each exercise.  Start with 1 or 2 of these exercises that you are most comfortable with. Do not do any exercises that cause you significant worsening pain. Some of these may cause some "stretching soreness" but it should go away after you stop the exercise, and get better over time. Gradually increase up to 3-4 exercises as tolerated.  Standing hamstring stretch: Place the heel of your leg on a stool about 15 inches high. Keep your knee straight. Lean forward, bending at the hips until you feel a mild stretch in the back of your thigh. Make sure you do not roll your shoulders and bend at the waist when doing this or you will stretch your lower back instead. Hold the stretch for 15 to 30 seconds. Repeat 3 times. Repeat the same stretch on your other leg.  Cat and camel: Get down on your hands and knees. Let your stomach sag, allowing your back to curve downward. Hold this position for 5 seconds. Then arch your back and hold for 5 seconds. Do 3 sets of 10.  Quadriped Arm/Leg Raises: Get down on your hands and knees. Tighten your abdominal muscles to stiffen your spine. While keeping your abdominals  tight, raise one arm and the opposite leg away from you. Hold this position for 5 seconds. Lower your arm and leg slowly and alternate sides. Do this 10 times on each side.  Pelvic tilt: Lie on your back with your knees bent and your feet flat on the floor. Tighten your abdominal muscles and push your lower back into the floor. Hold this position for 5 seconds, then relax. Do 3 sets of 10.  Partial curl: Lie on your back with your knees bent and your feet flat on the floor. Tighten your stomach muscles and flatten your back against the floor. Tuck your chin to your chest. With your hands stretched out in front of you, curl your upper body forward until your shoulders clear the floor. Hold this position for 3 seconds. Don't hold your breath. It helps to breathe out as you lift your shoulders up. Relax. Repeat 10 times. Build to 3 sets of 10. To challenge yourself, clasp your hands behind your head and keep your elbows out to the side.  Lower trunk rotation: Lie on your back with your knees bent and your feet flat on the floor. Tighten your abdominal muscles and push your lower back into the floor. Keeping your shoulders down flat, gently rotate your legs to one side, then the other as far as you can. Repeat 10 to 20 times.  Single knee to chest  stretch: Lie on your back with your legs straight out in front of you. Bring one knee up to your chest and grasp the back of your thigh. Pull your knee toward your chest, stretching your buttock muscle. Hold this position for 15 to 30 seconds and return to the starting position. Repeat 3 times on each side.  Double knee to chest: Lie on your back with your knees bent and your feet flat on the floor. Tighten your abdominal muscles and push your lower back into the floor. Pull both knees up to your chest. Hold for 5 seconds and repeat 10 to 20 times.  Warning symptoms of possible EMERGENCY SPINAL CORD COMPRESSION (also called CAUDA EQUINA SYNDROME) - Leg or muscle  weakness, difficulty lifting or heavy muscles that aren't working (not talking about pain or numbness) - Numbness in your groin or saddle region - Unable to control your bowel or bladder with incontinence IF you get any of these symptoms this potentially could be a serious spinal cord injury and recommend that you go directly to the Hospital Emergency Dept  We will plan to see you back in 4 weeks for low back pain follow up and follow up on your hypertension  You will receive a survey after today's visit either digitally by e-mail or paper by USPS mail. Your experiences and feedback matter to [place].  Please respond so we know how we are doing as we provide care for you.  Call us with any questions/concerns/needs.  It is my goal to be available to you for your health concerns.  Thanks for choosing me to be a partner in your healthcare needs!  Charlaine Dalton, FNP-C Family Nurse Practitioner [facility] Phone: [phone]

## 2019-11-30 NOTE — Progress Notes (Signed)
Virtual Visit via Telephone  The purpose of this virtual visit is to provide medical care while limiting exposure to the novel coronavirus (COVID19) for both patient and office staff.  Consent was obtained for phone visit:  Yes.   Answered questions that patient had about telehealth interaction:  Yes.   I discussed the limitations, risks, security and privacy concerns of performing an evaluation and management service by telephone. I also discussed with the patient that there may be a patient responsible charge related to this service. The patient expressed understanding and agreed to proceed.  Patient is at home and is accessed via telephone Services are provided by Charlaine Dalton, FNP-C from [facility])  ---------------------------------------------------------------------- Chief Complaint  Patient presents with  . Back Pain    X1 day, been doing heavy lifting at work for the past 2 month, lower back dull pain bend forward burns when she sits, left buttocks going down left leg, heating pad helped some, coldness helps some and putting pillow between legs    S: Reviewed CMA documentation. I have called patient and gathered additional HPI as follows:  Jaclyn Mcknight presents for telemedicine visit for concerns of low back pain x 1 day.  Reports that she has been doing heavy lifting at work for the past 2 months.  Has left sided low back pain, that radiates down leg to the back of the left knee.  Has had the pain wake her from sleep.  Denies saddle anesthesia or change in bowel/bladder function.  Denies any sudden traumatic injury.  Has alternated between using heating pad and cold therapy with some relief of symptoms.  Reports history of back pain in the past.  Patient is currently home Denies any high risk travel to areas of current concern for COVID19. Denies any known or suspected exposure to person with or possibly with COVID19.  Denies any fevers, chills,  sweats, body ache, cough, shortness of breath, sinus pain or pressure, headache, abdominal pain, diarrhea  Past Medical History:  Diagnosis Date  . Anxiety   . Back pain at L4-L5 level   . Gravida 3 para 3   . Hypertension   . Migraines    MIGRAINES  . Seizures (HCC)    X1 IN 2016   Social History   Tobacco Use  . Smoking status: Never Smoker  . Smokeless tobacco: Never Used  Vaping Use  . Vaping Use: Never used  Substance Use Topics  . Alcohol use: Yes    Alcohol/week: 1.0 standard drink    Types: 1 Glasses of wine per week    Comment: 2 glasses wine per month.  . Drug use: Yes    Types: Marijuana    Comment: OCC    Current Outpatient Medications:  .  lisinopril-hydrochlorothiazide (ZESTORETIC) 20-25 MG tablet, Take 1 tablet by mouth daily., Disp: 90 tablet, Rfl: 1 .  SUMAtriptan (IMITREX) 50 MG tablet, Take 0.5-1 tablets (25-50 mg total) by mouth once as needed for up to 1 dose for migraine. May repeat one dose in 2 hours if headache persists, for max dose 24 hours, Disp: 12 tablet, Rfl: 2 .  venlafaxine XR (EFFEXOR XR) 37.5 MG 24 hr capsule, Take 1 capsule (37.5 mg total) by mouth daily with breakfast., Disp: 30 capsule, Rfl: 2 .  cyclobenzaprine (FLEXERIL) 5 MG tablet, Take 1 tablet (5 mg total) by mouth 3 (three) times daily as needed for muscle spasms., Disp: 30 tablet, Rfl: 1 .  ibuprofen (ADVIL,MOTRIN) 200  MG tablet, Take 800 mg by mouth every 8 (eight) hours as needed (for pain.). (Patient not taking: Reported on 11/30/2019), Disp: , Rfl:  .  predniSONE (DELTASONE) 20 MG tablet, Take 2 tablets (40 mg total) by mouth daily with breakfast for 5 days., Disp: 10 tablet, Rfl: 0  Depression screen [facility] 2/9 11/30/2019 05/07/2019 03/08/2019  Decreased Interest 0 0 0  Down, Depressed, Hopeless 0 0 0  PHQ - 2 Score 0 0 0  Altered sleeping 0 0 3  Tired, decreased energy 0 0 1  Change in appetite 0 0 0  Feeling bad or failure about yourself  0 0 0  Trouble concentrating 0 0 0   Moving slowly or fidgety/restless 0 0 0  Suicidal thoughts 0 0 0  PHQ-9 Score 0 0 4  Difficult doing work/chores Not difficult at all Not difficult at all Not difficult at all    No flowsheet data found.  -------------------------------------------------------------------------- O: No physical exam performed due to remote telephone encounter.  Physical Exam: Patient remotely monitored without video.  Verbal communication appropriate.  Cognition normal.  No results found for this or any previous visit (from the past 2160 hour(s)).  -------------------------------------------------------------------------- A&P:  Problem List Items Addressed This Visit      Other   Low back pain - Primary    Acute low back pain with reported radicular symptoms down left leg through left knee.  Discussed with history of low back pain, could take anywhere from 1-6 months for full recovery.  Will treat with prednisone x 5 days and cyclobenzaprine for muscle relief.  Will send copy of AVS through patient portal with back stretching exercises.  Plan: 1. Begin prednisone 40mg  daily x 5 days 2. Begin cyclobenzaprine 5mg  TID PRN for muscle pain/spasm 3. Begin low back exercises as tolerated 4. Continue heat/cold therapy as needed for symptoms 5. Follow up in 4 weeks, sooner if needed      Relevant Medications   predniSONE (DELTASONE) 20 MG tablet   cyclobenzaprine (FLEXERIL) 5 MG tablet      Meds ordered this encounter  Medications  . predniSONE (DELTASONE) 20 MG tablet    Sig: Take 2 tablets (40 mg total) by mouth daily with breakfast for 5 days.    Dispense:  10 tablet    Refill:  0  . cyclobenzaprine (FLEXERIL) 5 MG tablet    Sig: Take 1 tablet (5 mg total) by mouth 3 (three) times daily as needed for muscle spasms.    Dispense:  30 tablet    Refill:  1    Follow-up: - Return as needed for this and in 4 weeks for hypertension follow up visit   Patient verbalizes understanding with the  above medical recommendations including the limitation of remote medical advice.  Specific follow-up and call-back criteria were given for patient to follow-up or seek medical care more urgently if needed.  - Time spent in direct consultation with patient on phone: 5 minutes  , FNP-C [facility] 11/30/2019, 8:32 AM

## 2019-11-30 NOTE — Assessment & Plan Note (Signed)
Acute low back pain with reported radicular symptoms down left leg through left knee.  Discussed with history of low back pain, could take anywhere from 1-6 months for full recovery.  Will treat with prednisone x 5 days and cyclobenzaprine for muscle relief.  Will send copy of AVS through patient portal with back stretching exercises.  Plan: 1. Begin prednisone 40mg  daily x 5 days 2. Begin cyclobenzaprine 5mg  TID PRN for muscle pain/spasm 3. Begin low back exercises as tolerated 4. Continue heat/cold therapy as needed for symptoms 5. Follow up in 4 weeks, sooner if needed

## 2020-01-05 ENCOUNTER — Other Ambulatory Visit: Payer: Self-pay | Admitting: Nurse Practitioner

## 2020-01-05 DIAGNOSIS — I10 Essential (primary) hypertension: Secondary | ICD-10-CM

## 2020-01-05 MED ORDER — LISINOPRIL-HYDROCHLOROTHIAZIDE 20-25 MG PO TABS: 1.0000 | ORAL_TABLET | Freq: Every day | ORAL | 0 refills | Status: DC

## 2020-01-05 NOTE — Telephone Encounter (Signed)
Requested Prescriptions  Pending Prescriptions Disp Refills  . lisinopril-hydrochlorothiazide (ZESTORETIC) 20-25 MG tablet [Pharmacy Med Name: Lisinopril-hydroCHLOROthiazide 20-25 MG Oral Tablet] 30 tablet 0    Sig: Take 1 tablet by mouth once daily     Cardiovascular:  ACEI + Diuretic Combos Failed - 01/05/2020  4:39 PM      Failed - Na in normal range and within 180 days    Sodium  Date Value Ref Range Status  11/28/2017 140 135 - 146 mmol/L Final         Failed - K in normal range and within 180 days    Potassium  Date Value Ref Range Status  11/28/2017 4.6 3.5 - 5.3 mmol/L Final         Failed - Cr in normal range and within 180 days    Creat  Date Value Ref Range Status  11/28/2017 0.94 0.50 - 1.10 mg/dL Final         Failed - Ca in normal range and within 180 days    Calcium  Date Value Ref Range Status  11/28/2017 9.3 8.6 - 10.2 mg/dL Final         Failed - Last BP in normal range    BP Readings from Last 1 Encounters:  08/06/19 (!) 156/108         Failed - Valid encounter within last 6 months    Recent Outpatient Visits          1 month ago Acute left-sided low back pain with left-sided sciatica   [facility], Jodelle Gross, FNP   8 months ago Migraine without aura and responsive to treatment   [facility] Smitty Cords, DO   10 months ago Essential (primary) hypertension   [facility] Galen Manila, NP   2 years ago Essential (primary) hypertension   [facility] Kyung Rudd, Alison Stalling, NP   2 years ago Breast pain, right   [facility] Kyung Rudd, Alison Stalling, NP             Passed - Patient is not pregnant

## 2020-01-05 NOTE — Telephone Encounter (Signed)
When initially filled class was print. Refilled electronically on 2nd attempt

## 2020-01-05 NOTE — Addendum Note (Signed)
Addended by: Garrison Columbus on: 01/05/2020 04:48 PM   Modules accepted: Orders

## 2020-03-04 ENCOUNTER — Telehealth (INDEPENDENT_AMBULATORY_CARE_PROVIDER_SITE_OTHER): Payer: Commercial Managed Care - PPO | Admitting: Family Medicine

## 2020-03-04 ENCOUNTER — Encounter: Payer: Self-pay | Admitting: Family Medicine

## 2020-03-04 ENCOUNTER — Other Ambulatory Visit: Payer: Self-pay

## 2020-03-04 DIAGNOSIS — I10 Essential (primary) hypertension: Secondary | ICD-10-CM

## 2020-03-04 MED ORDER — LISINOPRIL-HYDROCHLOROTHIAZIDE 20-25 MG PO TABS: 1.0000 | ORAL_TABLET | Freq: Every day | ORAL | 0 refills | Status: DC

## 2020-03-04 NOTE — Patient Instructions (Signed)
As we discussed, continue your lisinopril-HCTZ 20-25mg  daily.  I have sent in a 30 day refill, it is important that we see you back in office in 3-4 weeks to have labs drawn and evaluate your blood pressure in clinic.  Try to get exercise a minimum of 30 minutes per day at least 5 days per week as well as  adequate water intake all while measuring blood pressure a few times per week.  Keep a blood pressure log and bring back to clinic at your next visit.  If your readings are consistently over 130/80 to contact our office/send me a MyChart message and we will see you sooner.  Can try DASH and Mediterranean diet options, avoiding processed foods, lowering sodium intake, avoiding pork products, and eating a plant based diet for optimal health.  Concerns for untreated hypertension can lead to kidney disease, kidney failure, heart attack, stroke and up to and including death, as likely outcomes if non-compliant with blood pressure regulation.  All of these habits are attached to each other and each has the effect on each other.  We will plan to see you back in 3-4 weeks for hypertension follow up visit  You will receive a survey after today's visit either digitally by e-mail or paper by USPS mail. Your experiences and feedback matter to [place].  Please respond so we know how we are doing as we provide care for you.  Call us with any questions/concerns/needs.  It is my goal to be available to you for your health concerns.  Thanks for choosing me to be a partner in your healthcare needs!  Charlaine Dalton, FNP-C Family Nurse Practitioner [facility] Phone: [phone]

## 2020-03-04 NOTE — Assessment & Plan Note (Signed)
Has not been seen in clinic since 11/2017, no home BP readings available to review during virtual visit.  Will send in refill on Lisinopril-HCTZ 20-25mg  x 30 days.  Encouraged follow up visit and the importance of labs for evaluation of liver + kidney function, electrolytes, anemia, platelets, lipids and thyroid.  Patient will schedule appointment for in clinic eval in less than 30 days.  Plan: 1. Refill of Lisinopril-HCTZ 20-25mg  sent to pharmacy on file x 30 days 2. RTC in 3-4 weeks for in office HTN f/u appointment

## 2020-03-04 NOTE — Progress Notes (Signed)
Virtual Visit via Telephone  The purpose of this virtual visit is to provide medical care while limiting exposure to the novel coronavirus (COVID19) for both patient and office staff.  Consent was obtained for phone visit:  Yes.   Answered questions that patient had about telehealth interaction:  Yes.   I discussed the limitations, risks, security and privacy concerns of performing an evaluation and management service by telephone. I also discussed with the patient that there may be a patient responsible charge related to this service. The patient expressed understanding and agreed to proceed.  Patient is at home and is accessed via telephone Services are provided by Charlaine Dalton, FNP-C from [facility])  ---------------------------------------------------------------------- Chief Complaint  Patient presents with  . Hypertension    need refill    S: Reviewed CMA documentation. I have called patient and gathered additional HPI as follows:  Ms. Spraggins presents for virtual telemedicine visit for refill on her lisinopril-HCTZ 20-25mg  daily.  Has not been seen in clinic since 11/2017.  Last recorded blood pressure was in [facility] ED on 08/06/2019 with a reading of 161/106.  Has not been taking blood pressure readings while at home.  Denies headaches, dizziness, visual changes, lightheadedness, SOB, DOE, CP, palpitations, or leg swelling.  Patient is currently home Denies any high risk travel to areas of current concern for COVID19. Denies any known or suspected exposure to person with or possibly with COVID19.  Past Medical History:  Diagnosis Date  . Anxiety   . Back pain at L4-L5 level   . Gravida 3 para 3   . Hypertension   . Migraines    MIGRAINES  . Seizures (HCC)    X1 IN 2016   Social History   Tobacco Use  . Smoking status: Never Smoker  . Smokeless tobacco: Never Used  Vaping Use  . Vaping Use: Never used  Substance Use Topics  .  Alcohol use: Yes    Alcohol/week: 1.0 standard drink    Types: 1 Glasses of wine per week    Comment: 2 glasses wine per month.  . Drug use: Yes    Types: Marijuana    Comment: OCC    Current Outpatient Medications:  .  cyclobenzaprine (FLEXERIL) 5 MG tablet, Take 1 tablet (5 mg total) by mouth 3 (three) times daily as needed for muscle spasms., Disp: 30 tablet, Rfl: 1 .  ibuprofen (ADVIL,MOTRIN) 200 MG tablet, Take 800 mg by mouth every 8 (eight) hours as needed (for pain.). , Disp: , Rfl:  .  lisinopril-hydrochlorothiazide (ZESTORETIC) 20-25 MG tablet, Take 1 tablet by mouth daily., Disp: 30 tablet, Rfl: 0 .  SUMAtriptan (IMITREX) 50 MG tablet, Take 0.5-1 tablets (25-50 mg total) by mouth once as needed for up to 1 dose for migraine. May repeat one dose in 2 hours if headache persists, for max dose 24 hours, Disp: 12 tablet, Rfl: 2 .  venlafaxine XR (EFFEXOR XR) 37.5 MG 24 hr capsule, Take 1 capsule (37.5 mg total) by mouth daily with breakfast., Disp: 30 capsule, Rfl: 2  Depression screen [facility] 2/9 11/30/2019 05/07/2019 03/08/2019  Decreased Interest 0 0 0  Down, Depressed, Hopeless 0 0 0  PHQ - 2 Score 0 0 0  Altered sleeping 0 0 3  Tired, decreased energy 0 0 1  Change in appetite 0 0 0  Feeling bad or failure about yourself  0 0 0  Trouble concentrating 0 0 0  Moving slowly or fidgety/restless 0  0 0  Suicidal thoughts 0 0 0  PHQ-9 Score 0 0 4  Difficult doing work/chores Not difficult at all Not difficult at all Not difficult at all    No flowsheet data found.  -------------------------------------------------------------------------- O: No physical exam performed due to remote telephone encounter.  Physical Exam: Patient remotely monitored without video.  Verbal communication appropriate.  Cognition normal.  No results found for this or any previous visit (from the past 2160  hour(s)).  -------------------------------------------------------------------------- A&P:  Problem List Items Addressed This Visit      Cardiovascular and Mediastinum   Essential (primary) hypertension    Has not been seen in clinic since 11/2017, no home BP readings available to review during virtual visit.  Will send in refill on Lisinopril-HCTZ 20-25mg  x 30 days.  Encouraged follow up visit and the importance of labs for evaluation of liver + kidney function, electrolytes, anemia, platelets, lipids and thyroid.  Patient will schedule appointment for in clinic eval in less than 30 days.  Plan: 1. Refill of Lisinopril-HCTZ 20-25mg  sent to pharmacy on file x 30 days 2. RTC in 3-4 weeks for in office HTN f/u appointment      Relevant Medications   lisinopril-hydrochlorothiazide (ZESTORETIC) 20-25 MG tablet      Meds ordered this encounter  Medications  . lisinopril-hydrochlorothiazide (ZESTORETIC) 20-25 MG tablet    Sig: Take 1 tablet by mouth daily.    Dispense:  30 tablet    Refill:  0    Please call office to schedule appt for further RF. Courtesy RF    Follow-up: - Return in 3-4 weeks for in office hypertension follow up visit  Patient verbalizes understanding with the above medical recommendations including the limitation of remote medical advice.  Specific follow-up and call-back criteria were given for patient to follow-up or seek medical care more urgently if needed.  - Time spent in direct consultation with patient on phone: 5 minutes  Charlaine Dalton, FNP-C [facility] 03/04/2020, 1:54 PM

## 2020-04-21 ENCOUNTER — Other Ambulatory Visit: Payer: Self-pay | Admitting: Family Medicine

## 2020-04-21 DIAGNOSIS — I10 Essential (primary) hypertension: Secondary | ICD-10-CM

## 2020-04-21 NOTE — Telephone Encounter (Signed)
Requested medication (s) are due for refill today: yes  Requested medication (s) are on the active medication list: yes  Last refill:  03/04/20  #30  0 refills courtesy refill  Future visit scheduled:yes  Notes to clinic:  Please review patient has scheduled My chart visit 04/29/20. Last was courtesy refill    Requested Prescriptions  Pending Prescriptions Disp Refills   lisinopril-hydrochlorothiazide (ZESTORETIC) 20-25 MG tablet 30 tablet 0    Sig: Take 1 tablet by mouth daily.      Cardiovascular:  ACEI + Diuretic Combos Failed - 04/21/2020  1:12 PM      Failed - Na in normal range and within 180 days    Sodium  Date Value Ref Range Status  11/28/2017 140 135 - 146 mmol/L Final          Failed - K in normal range and within 180 days    Potassium  Date Value Ref Range Status  11/28/2017 4.6 3.5 - 5.3 mmol/L Final          Failed - Cr in normal range and within 180 days    Creat  Date Value Ref Range Status  11/28/2017 0.94 0.50 - 1.10 mg/dL Final          Failed - Ca in normal range and within 180 days    Calcium  Date Value Ref Range Status  11/28/2017 9.3 8.6 - 10.2 mg/dL Final          Failed - Last BP in normal range    BP Readings from Last 1 Encounters:  08/06/19 (!) 156/108          Passed - Patient is not pregnant      Passed - Valid encounter within last 6 months    Recent Outpatient Visits           1 month ago Essential (primary) hypertension   [facility], Jodelle Gross, FNP   4 months ago Acute left-sided low back pain with left-sided sciatica   [facility], Jodelle Gross, FNP   11 months ago Migraine without aura and responsive to treatment   [facility] Smitty Cords, DO   1 year ago Essential (primary) hypertension   [facility] Kyung Rudd, Alison Stalling, NP   2 years ago Essential (primary) hypertension   [facility] Kyung Rudd, Alison Stalling, NP        Future Appointments             In 1 week [place], Jodelle Gross, FNP [facility], [facility]

## 2020-04-21 NOTE — Telephone Encounter (Signed)
Pt has an mychart visit on 04-29-2020. Pt was told to have in person visit however the pt has sore throat. Pt would like a refill on lisinopril-hctz 20-25 mg. [facility] in [place]. Pt last video visit was 03-04-2020

## 2020-04-22 MED ORDER — LISINOPRIL-HYDROCHLOROTHIAZIDE 20-25 MG PO TABS: 1.0000 | ORAL_TABLET | Freq: Every day | ORAL | 0 refills | Status: DC

## 2020-04-29 ENCOUNTER — Telehealth: Payer: 59 | Admitting: Family Medicine

## 2020-04-29 ENCOUNTER — Other Ambulatory Visit: Payer: Self-pay

## 2020-05-01 ENCOUNTER — Ambulatory Visit (INDEPENDENT_AMBULATORY_CARE_PROVIDER_SITE_OTHER): Payer: Commercial Managed Care - PPO | Admitting: Family Medicine

## 2020-05-01 ENCOUNTER — Encounter: Payer: Self-pay | Admitting: Family Medicine

## 2020-05-01 ENCOUNTER — Other Ambulatory Visit: Payer: Self-pay

## 2020-05-01 VITALS — BP 125/87 | HR 80 | Resp 17 | Ht 66.0 in | Wt 160.2 lb

## 2020-05-01 DIAGNOSIS — I10 Essential (primary) hypertension: Secondary | ICD-10-CM | POA: Diagnosis not present

## 2020-05-01 DIAGNOSIS — M5442 Lumbago with sciatica, left side: Secondary | ICD-10-CM

## 2020-05-01 DIAGNOSIS — F419 Anxiety disorder, unspecified: Secondary | ICD-10-CM | POA: Diagnosis not present

## 2020-05-01 MED ORDER — CYCLOBENZAPRINE HCL 5 MG PO TABS: 5.0000 mg | ORAL_TABLET | Freq: Three times a day (TID) | ORAL | 1 refills | Status: DC | PRN

## 2020-05-01 MED ORDER — VENLAFAXINE HCL ER 37.5 MG PO CP24: 37.5000 mg | ORAL_CAPSULE | Freq: Every day | ORAL | 1 refills | Status: DC

## 2020-05-01 NOTE — Patient Instructions (Signed)
I have sent in the venlafaxine XR 37.5mg  prescription to take 1 tablet daily.  Continue all other medications as prescribed.  As we discussed, it is important that we have your labs drawn to evaluate your kidney and electrolytes with your prescription for your blood pressure as this medication can increase/decrease levels.  We will plan to see you back in 3 months for your physical, PAP and labs  You will receive a survey after today's visit either digitally by e-mail or paper by [employer]. Your experiences and feedback matter to [place].  Please respond so we know how we are doing as we provide care for you.  Call us with any questions/concerns/needs.  It is my goal to be available to you for your health concerns.  Thanks for choosing me to be a partner in your healthcare needs!  Charlaine Dalton, FNP-C Family Nurse Practitioner [facility] Phone: [phone]

## 2020-05-01 NOTE — Assessment & Plan Note (Signed)
PHQ9-3/GAD7-1.  Previously taking venlafaxine XR 37.5mg  and tolerated well.  Is interested in restarting on this prescription.  Will restart.  To review mood handout.  To RTC in 3 months for re-evaluation  Plan: 1. Restart venlafaxine 37.5mg  daily 2. Review mood handout 3. RTC in 3 months

## 2020-05-01 NOTE — Assessment & Plan Note (Signed)
Hx of low back pain, was well controlled with cyclobenazprine, is interested in addition refill to have on hand for intermittent lower back pain.  Will refill.

## 2020-05-01 NOTE — Assessment & Plan Note (Signed)
Controlled hypertension.  BP is at goal.  Pt is working on lifestyle modifications.  Taking medications tolerating well without side effects.  Discussed importance of having labs drawn to evaluate kidney function and potassium due to BP medication, verbalized understanding. Complications:  Overweight  Plan: 1. Continue taking lisinopril-hydrochlorothiazide 20-25mg  daily 2. Obtain labs to be drawn at CPE in 3 months  3. Encouraged heart healthy diet and increasing exercise to 30 minutes most days of the week, going no more than 2 days in a row without exercise. 4. Check BP 1-2 x per week at home, keep log, and bring to clinic at next appointment. 5. Follow up 3 months.

## 2020-05-01 NOTE — Progress Notes (Signed)
Subjective:    Patient ID: Jaclyn Mcknight, female    DOB: 07-25-1970, 49 y.o.   MRN: [id number]  Jaclyn Mcknight is a 49 y.o. female presenting on 05/01/2020 for Hypertension and Anxiety (pt been off of the Effexor x and would like to start back on the medication. )   HPI   [name] presents to clinic for a follow up on her hypertension and anxiety.  Reports she has been off the venlafaxine XR 37.5mg  for approximately 6 months and would like to restart on this prescription.  Hypertension - She is not checking BP at home or outside of clinic.    - Current medications: lisinopril-hydrochlorothiazide 20-25mg  daily, tolerating well without side effects - She is not currently symptomatic. - Pt denies headache, lightheadedness, dizziness, changes in vision, chest tightness/pressure, palpitations, leg swelling, sudden loss of speech or loss of consciousness. - She  reports no regular exercise routine. - Her diet is moderate in salt, moderate in fat, and moderate in carbohydrates.  Depression screen [facility] 2/9 05/01/2020 11/30/2019 05/07/2019  Decreased Interest 0 0 0  Down, Depressed, Hopeless 0 0 0  PHQ - 2 Score 0 0 0  Altered sleeping 3 0 0  Tired, decreased energy 0 0 0  Change in appetite 0 0 0  Feeling bad or failure about yourself  0 0 0  Trouble concentrating 0 0 0  Moving slowly or fidgety/restless 0 0 0  Suicidal thoughts 0 0 0  PHQ-9 Score 3 0 0  Difficult doing work/chores Not difficult at all Not difficult at all Not difficult at all    Social History   Tobacco Use  . Smoking status: Never Smoker  . Smokeless tobacco: Never Used  Vaping Use  . Vaping Use: Never used  Substance Use Topics  . Alcohol use: Yes    Alcohol/week: 1.0 standard drink    Types: 1 Glasses of wine per week    Comment: 2 glasses wine per month.  . Drug use: Not Currently    Types: Marijuana    Comment: OCC    Review of Systems  Constitutional: Negative.   HENT: Negative.     Eyes: Negative.   Respiratory: Negative.   Cardiovascular: Negative.   Gastrointestinal: Negative.   Endocrine: Negative.   Genitourinary: Negative.   Musculoskeletal: Negative.   Skin: Negative.   Allergic/Immunologic: Negative.   Neurological: Negative.   Hematological: Negative.   Psychiatric/Behavioral: Negative for agitation, behavioral problems, confusion, decreased concentration, dysphoric mood, hallucinations, self-injury, sleep disturbance and suicidal ideas. The patient is nervous/anxious. The patient is not hyperactive.    Per HPI unless specifically indicated above     Objective:    BP 125/87 (BP Location: Left Arm, Patient Position: Sitting, Cuff Size: Normal)   Pulse 80   Resp 17   Ht 5\' 6"  (1.676 m)   Wt 160 lb 3.2 oz (72.7 kg)   SpO2 100%   BMI 25.86 kg/m   Wt Readings from Last 3 Encounters:  05/01/20 160 lb 3.2 oz (72.7 kg)  11/30/19 165 lb (74.8 kg)  01/04/18 161 lb (73 kg)    Physical Exam Vitals and nursing note reviewed.  Constitutional:      General: She is not in acute distress.    Appearance: Normal appearance. She is well-developed and well-groomed. She is not ill-appearing or toxic-appearing.  HENT:     Head: Normocephalic and atraumatic.     Nose:     Comments: 03/06/18 is in  place, covering mouth and nose. Eyes:     General: Lids are normal. Vision grossly intact.        Right eye: No discharge.        Left eye: No discharge.     Extraocular Movements: Extraocular movements intact.     Conjunctiva/sclera: Conjunctivae normal.     Pupils: Pupils are equal, round, and reactive to light.  Cardiovascular:     Rate and Rhythm: Normal rate and regular rhythm.     Pulses: Normal pulses.     Heart sounds: Normal heart sounds. No murmur heard.  No friction rub. No gallop.   Pulmonary:     Effort: Pulmonary effort is normal. No respiratory distress.     Breath sounds: Normal breath sounds.  Musculoskeletal:     Right lower leg: No edema.      Left lower leg: No edema.  Skin:    General: Skin is warm and dry.     Capillary Refill: Capillary refill takes less than 2 seconds.  Neurological:     General: No focal deficit present.     Mental Status: She is alert and oriented to person, place, and time.  Psychiatric:        Attention and Perception: Attention and perception normal.        Mood and Affect: Mood and affect normal.        Speech: Speech normal.        Behavior: Behavior normal. Behavior is cooperative.        Thought Content: Thought content normal.        Cognition and Memory: Cognition and memory normal.        Judgment: Judgment normal.    Results for orders placed or performed during the hospital encounter of 12/21/17  Urine Drug Screen, Qualitative ([facility] only)  Result Value Ref Range   Tricyclic, Ur Screen NONE DETECTED NONE DETECTED   Amphetamines, Ur Screen NONE DETECTED NONE DETECTED   MDMA (Ecstasy)Ur Screen NONE DETECTED NONE DETECTED   Cocaine Metabolite,Ur [place] NONE DETECTED NONE DETECTED   Opiate, Ur Screen NONE DETECTED NONE DETECTED   Phencyclidine (PCP) Ur S NONE DETECTED NONE DETECTED   Cannabinoid 50 Ng, Ur [place] NONE DETECTED NONE DETECTED   Barbiturates, Ur Screen NONE DETECTED NONE DETECTED   Benzodiazepine, Ur Scrn NONE DETECTED NONE DETECTED   Methadone Scn, Ur NONE DETECTED NONE DETECTED  Pregnancy, urine POC  Result Value Ref Range   Preg Test, Ur NEGATIVE NEGATIVE  Surgical pathology  Result Value Ref Range   SURGICAL PATHOLOGY      Surgical Pathology CASE: [id number] PATIENT: Debie Cockerham Surgical Pathology Report     SPECIMEN SUBMITTED: A. Breast mass right  CLINICAL HISTORY: None provided  PRE-OPERATIVE DIAGNOSIS: Right breast mass  POST-OPERATIVE DIAGNOSIS: Same as pre op     DIAGNOSIS: A.  RIGHT BREAST MASS; RIGHT BREAST CENTRAL DUCT EXCISION: - DILATED DUCTS WITH DUCT ECTASIA. - CAUTERY ARTIFACT PARTIALLY LIMITS INTERPRETATION. - NEGATIVE FOR  ATYPIA AND MALIGNANCY.   GROSS DESCRIPTION: A. Labeled: Right breast mass Received: In formalin Accompanying specimen radiograph: No Radiographic findings: Not applicable Time in fixative: According requisition 12:25 PM on 12/21/2017  Cold ischemic time: Time not provided Total fixation time: 8 hours Type of procedure: Lumpectomy Location / laterality of specimen: Right Orientation of specimen: Cranial, lateral and deep metallic markers Inking: Superior = blue Inferior = green Medial = yellow Lateral = orange Posteri or = black Anterior/Superficial = red Size of specimen:  3.5 (medial-lateral by 2.4 (superior-inferior) by 2.1 (anterior from posterior) centimeter Skin: None grossly identified Biopsy site: None grossly identified Number of discrete masses: No mass grossly identified, on the most superficial aspect there is purple fibrous tissue, no definable masses Description of remainder of tissue: Yellow lobulated focally fibrous  Block summary: 1-7 - entire specimen from medial towards lateral respectively and consecutively    Final Diagnosis performed by Glenice Bow, MD.   Electronically signed 12/22/2017 4:31:06PM The electronic signature indicates that the named Attending Pathologist has evaluated the specimen  Technical component performed at [facility], [address] [place], [place] [address] Lab: [phone] Dir: Jolene Schimke, MD, MMM  Professional component performed at [facility], [facility], [address] [place], [place], [place] [address] Lab: [phone] Dir: Georgiann Cocker. Oneita Kras, MD       Assessment & Plan:   Problem List Items Addressed This Visit      Cardiovascular and Mediastinum   Essential (primary) hypertension - Primary    Controlled hypertension.  BP is at goal.  Pt is working on lifestyle modifications.  Taking medications tolerating well without side effects.  Discussed importance of having labs drawn to evaluate kidney function and  potassium due to BP medication, verbalized understanding. Complications:  Overweight  Plan: 1. Continue taking lisinopril-hydrochlorothiazide 20-25mg  daily 2. Obtain labs to be drawn at CPE in 3 months  3. Encouraged heart healthy diet and increasing exercise to 30 minutes most days of the week, going no more than 2 days in a row without exercise. 4. Check BP 1-2 x per week at home, keep log, and bring to clinic at next appointment. 5. Follow up 3 months.          Other   Low back pain    Hx of low back pain, was well controlled with cyclobenazprine, is interested in addition refill to have on hand for intermittent lower back pain.  Will refill.      Relevant Medications   cyclobenzaprine (FLEXERIL) 5 MG tablet   Anxiety    PHQ9-3/GAD7-1.  Previously taking venlafaxine XR 37.5mg  and tolerated well.  Is interested in restarting on this prescription.  Will restart.  To review mood handout.  To RTC in 3 months for re-evaluation  Plan: 1. Restart venlafaxine 37.5mg  daily 2. Review mood handout 3. RTC in 3 months      Relevant Medications   venlafaxine XR (EFFEXOR XR) 37.5 MG 24 hr capsule      Meds ordered this encounter  Medications  . venlafaxine XR (EFFEXOR XR) 37.5 MG 24 hr capsule    Sig: Take 1 capsule (37.5 mg total) by mouth daily with breakfast.    Dispense:  90 capsule    Refill:  1  . cyclobenzaprine (FLEXERIL) 5 MG tablet    Sig: Take 1 tablet (5 mg total) by mouth 3 (three) times daily as needed for muscle spasms.    Dispense:  30 tablet    Refill:  1   Follow up plan: Return in about 3 months (around 07/30/2020) for CPE, PAP & Labs.   Charlaine Dalton, FNP Family Nurse Practitioner [facility] 05/01/2020, 4:11 PM

## 2020-06-11 ENCOUNTER — Other Ambulatory Visit: Payer: Self-pay | Admitting: Family Medicine

## 2020-06-11 DIAGNOSIS — I10 Essential (primary) hypertension: Secondary | ICD-10-CM

## 2020-06-19 ENCOUNTER — Other Ambulatory Visit: Payer: Self-pay | Admitting: Family Medicine

## 2020-06-19 DIAGNOSIS — I10 Essential (primary) hypertension: Secondary | ICD-10-CM

## 2020-06-26 ENCOUNTER — Other Ambulatory Visit: Payer: Self-pay | Admitting: Family Medicine

## 2020-06-26 DIAGNOSIS — I10 Essential (primary) hypertension: Secondary | ICD-10-CM

## 2020-06-26 MED ORDER — LISINOPRIL-HYDROCHLOROTHIAZIDE 20-25 MG PO TABS: 1.0000 | ORAL_TABLET | Freq: Every day | ORAL | 0 refills | Status: DC

## 2020-06-26 NOTE — Telephone Encounter (Signed)
Phone call to pt.  Scheduled for CPE in March.  Pt. Stated that the pharmacy has stated they did not receive the refill for Lisinopril-HCTZ on 06/11/20.  Advised pt. Will resend today.  Agreed with plan.  Requested Prescriptions  Pending Prescriptions Disp Refills  . lisinopril-hydrochlorothiazide (ZESTORETIC) 20-25 MG tablet 90 tablet 0    Sig: Take 1 tablet by mouth daily.     Cardiovascular:  ACEI + Diuretic Combos Failed - 06/26/2020 10:27 AM      Failed - Na in normal range and within 180 days    Sodium  Date Value Ref Range Status  11/28/2017 140 135 - 146 mmol/L Final         Failed - K in normal range and within 180 days    Potassium  Date Value Ref Range Status  11/28/2017 4.6 3.5 - 5.3 mmol/L Final         Failed - Cr in normal range and within 180 days    Creat  Date Value Ref Range Status  11/28/2017 0.94 0.50 - 1.10 mg/dL Final         Failed - Ca in normal range and within 180 days    Calcium  Date Value Ref Range Status  11/28/2017 9.3 8.6 - 10.2 mg/dL Final         Passed - Patient is not pregnant      Passed - Last BP in normal range    BP Readings from Last 1 Encounters:  05/01/20 125/87         Passed - Valid encounter within last 6 months    Recent Outpatient Visits          1 month ago Essential (primary) hypertension   [facility], Jodelle Gross, FNP   3 months ago Essential (primary) hypertension   [facility] Jodelle Gross, FNP   6 months ago Acute left-sided low back pain with left-sided sciatica   [facility], Jodelle Gross, FNP   1 year ago Migraine without aura and responsive to treatment   [facility] Smitty Cords, DO   1 year ago Essential (primary) hypertension   [facility] Kyung Rudd, Alison Stalling, NP      Future Appointments            In 1 month Gabriel Cirri, NP [facility], [facility]

## 2020-06-26 NOTE — Telephone Encounter (Signed)
Medication Refill - Medication: lisinopril-hydrochlorothiazide (ZESTORETIC) 20-25 MG tablet     Preferred Pharmacy (with phone number or street name): [id number] - [place] ([place]), [place] - [address] Phone:  [phone]  Fax:  [phone]       Agent: Please be advised that RX refills may take up to 3 business days. We ask that you follow-up with your pharmacy.

## 2020-07-18 ENCOUNTER — Encounter: Payer: Self-pay | Admitting: Unknown Physician Specialty

## 2020-08-12 ENCOUNTER — Encounter: Payer: Commercial Managed Care - PPO | Admitting: Unknown Physician Specialty

## 2020-08-19 ENCOUNTER — Encounter: Payer: Commercial Managed Care - PPO | Admitting: Unknown Physician Specialty

## 2021-02-16 ENCOUNTER — Telehealth: Payer: Self-pay | Admitting: Family Medicine

## 2021-02-16 DIAGNOSIS — I10 Essential (primary) hypertension: Secondary | ICD-10-CM

## 2021-02-16 MED ORDER — LISINOPRIL-HYDROCHLOROTHIAZIDE 20-25 MG PO TABS: 1.0000 | ORAL_TABLET | Freq: Every day | ORAL | 0 refills | Status: DC

## 2021-02-16 NOTE — Telephone Encounter (Signed)
The pt was sent in a 15 day supply of her blood pressure medication. No refills without a in person visit in the office. I attempted to contact the patient to notify her of this, no answer. VM not set-up.

## 2021-02-16 NOTE — Telephone Encounter (Signed)
Previous pt of Danielle Rankin  called in for assistance with a medication follow up apt. Assisted pt with apt with Nicki Reaper. Pt says that she is out of her medication, pt would like to know if possible, is provider able to provide a supply until her apt?   Also advised pt to reach out to her pharmacy for possible assistance as well.    Please advise.   Medication-  lisinopril-hydrochlorothiazide (ZESTORETIC) 20-25 MG tablet    Pharmacy:  [facility] [address] ([place]), [place] - [address] Phone:  [phone]  Fax:  [phone]

## 2021-02-18 ENCOUNTER — Encounter: Payer: Self-pay | Admitting: Emergency Medicine

## 2021-02-18 ENCOUNTER — Other Ambulatory Visit: Payer: Self-pay

## 2021-02-18 ENCOUNTER — Emergency Department (HOSPITAL_COMMUNITY)
Admission: EM | Admit: 2021-02-18 | Discharge: 2021-02-18 | Discharge status: Against Medical Advice | Payer: Commercial Managed Care - PPO

## 2021-02-18 DIAGNOSIS — I10 Essential (primary) hypertension: Secondary | ICD-10-CM | POA: Insufficient documentation

## 2021-02-18 DIAGNOSIS — G43009 Migraine without aura, not intractable, without status migrainosus: Secondary | ICD-10-CM | POA: Insufficient documentation

## 2021-02-18 DIAGNOSIS — R519 Headache, unspecified: Secondary | ICD-10-CM | POA: Diagnosis present

## 2021-02-18 DIAGNOSIS — Z79899 Other long term (current) drug therapy: Secondary | ICD-10-CM | POA: Diagnosis not present

## 2021-02-18 NOTE — ED Notes (Signed)
Patient states she cant wait and is leaving

## 2021-02-18 NOTE — ED Triage Notes (Signed)
Patient ambulatory to triage with steady gait, without difficulty or distress noted; pt reports migraine, occipital and frontal accomp by nausea tonight; st hx of same and has ran out of her PRN meds for such; took [facility] powder PTA without relief

## 2021-02-19 ENCOUNTER — Emergency Department
Admission: EM | Admit: 2021-02-19 | Discharge: 2021-02-19 | Disposition: A | Discharge status: Home / Self Care | Payer: Commercial Managed Care - PPO | Attending: Emergency Medicine | Admitting: Emergency Medicine

## 2021-02-19 DIAGNOSIS — G43009 Migraine without aura, not intractable, without status migrainosus: Secondary | ICD-10-CM

## 2021-02-19 MED ORDER — DEXAMETHASONE SODIUM PHOSPHATE 10 MG/ML IJ SOLN
10.0000 mg | Freq: Once | INTRAMUSCULAR | Status: AC
  Administered 2021-02-19: 10 mg via INTRAVENOUS
  Filled 2021-02-19: qty 1

## 2021-02-19 MED ORDER — SUMATRIPTAN SUCCINATE 50 MG PO TABS: 25.0000 mg | ORAL_TABLET | Freq: Once | ORAL | 2 refills | Status: DC | PRN

## 2021-02-19 MED ORDER — SODIUM CHLORIDE 0.9 % IV BOLUS: 500.0000 mL | Freq: Once | INTRAVENOUS | Status: DC

## 2021-02-19 MED ORDER — DIPHENHYDRAMINE HCL 50 MG/ML IJ SOLN
12.5000 mg | INTRAMUSCULAR | Status: AC
  Administered 2021-02-19: 12.5 mg via INTRAVENOUS
  Filled 2021-02-19: qty 1

## 2021-02-19 MED ORDER — DROPERIDOL 2.5 MG/ML IJ SOLN
2.5000 mg | Freq: Once | INTRAMUSCULAR | Status: AC
  Administered 2021-02-19: 2.5 mg via INTRAVENOUS
  Filled 2021-02-19: qty 2

## 2021-02-19 MED ORDER — KETOROLAC TROMETHAMINE 30 MG/ML IJ SOLN
15.0000 mg | Freq: Once | INTRAMUSCULAR | Status: AC
  Administered 2021-02-19: 15 mg via INTRAVENOUS
  Filled 2021-02-19: qty 1

## 2021-02-19 NOTE — ED Provider Notes (Signed)
[facility] Emergency Department Provider Note  ____________________________________________   Event Date/Time   First MD Initiated Contact with Patient 02/19/21 [phone]     (approximate)  I have reviewed the triage vital signs and the nursing notes.   HISTORY  Chief Complaint Migraine    HPI Jaclyn Mcknight is a 50 y.o. female with medical history as listed below who presents for evaluation of acute onset severe sharp and aching and burning headache that occurred about 5 hours ago.  Nothing particular seem to started but she said that usually her migraines started as a result of her phone or something on television.  She said it feels different and worse than her usual migraines because there is pain in the back of her head and in the front as well as a burning sensation.  She says she did experience the visual aura that she is used to.  Light and loud noises are making the symptoms worse.  She has had nausea but no vomiting.  No focal weakness but some pain down into her left arm which she said is also normal for her.  No recent trauma.  Normally she takes Imitrex but she has run out.  She is on blood pressure medicine but has had no other recent medication changes.     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   BREAST LUMPECTOMY Right 12/21/2017   Procedure: EXCISION RIGHT BREAST NIPPLE MASS;  Surgeon: Leafy Ro, MD;  Location: [facility] ORS;  Service: General;  Laterality: Right;   TUBAL LIGATION      Prior to Admission medications   Medication Sig Start Date End Date Taking? Authorizing Provider  cyclobenzaprine (FLEXERIL) 5 MG tablet Take 1 tablet (5 mg total) by mouth 3 (three) times daily as needed for muscle spasms. 05/01/20   Malfi, Jodelle Gross, FNP  ibuprofen (ADVIL,MOTRIN) 200 MG tablet Take 800 mg by mouth every 8 (eight) hours as needed (for pain.).     [provider]  lisinopril-hydrochlorothiazide (ZESTORETIC) 20-25 MG tablet Take 1 tablet by mouth daily. 02/16/21   Lorre Munroe, NP  SUMAtriptan (IMITREX) 50 MG tablet Take 0.5-1 tablets (25-50 mg total) by mouth once as needed for up to 1 dose for migraine. May repeat one dose in 2 hours if headache persists, for max dose 24 hours 02/19/21   Loleta Rose, MD  venlafaxine XR (EFFEXOR XR) 37.5 MG 24 hr capsule Take 1 capsule (37.5 mg total) by mouth daily with breakfast. 05/01/20   Malfi, Jodelle Gross, FNP    Allergies Patient has no known allergies.  Family History  Problem Relation Age of Onset   Hypertension Mother    Diabetes Mother  Hypertension Sister    Hypertension Maternal Aunt    Diabetes Maternal Aunt    Hypertension Maternal Grandmother    Diabetes Maternal Grandmother     Social History Social History   Tobacco Use   Smoking status: Never   Smokeless tobacco: Never  Vaping Use   Vaping Use: Never used  Substance Use Topics   Alcohol use: Yes    Alcohol/week: 1.0 standard drink    Types: 1 Glasses of wine per week    Comment: 2 glasses wine per month.   Drug use: Not Currently    Types: Marijuana    Comment: OCC    Review of Systems Constitutional: No fever/chills Eyes: Positive for photophobia ENT: No sore throat. Cardiovascular: Denies chest pain. Respiratory: Denies shortness of  breath. Gastrointestinal: No abdominal pain.  Nausea, no vomiting.  No diarrhea.  No constipation. Genitourinary: Negative for dysuria. Musculoskeletal: Negative for neck pain.  Negative for back pain. Integumentary: Negative for rash. Neurological: Severe headache as described above with some pain in her left arm as well.  No focal weakness.   ____________________________________________   PHYSICAL EXAM:  VITAL SIGNS: ED Triage Vitals  Enc Vitals Group     BP 02/18/21 2330 (!) 178/113     Pulse Rate 02/18/21 2330 73     Resp 02/18/21 2330 20     Temp 02/18/21 2330 98.1 F (36.7 C)     Temp Source 02/19/21 0158 Oral     SpO2 02/18/21 2330 95 %     Weight 02/18/21 2331 66.2 kg (146 lb)     Height 02/18/21 2331 1.676 m (5\' 6" )     Head Circumference --      Peak Flow --      Pain Score 02/18/21 2331 9     Pain Loc --      Pain Edu? --      Excl. in GC? --     Constitutional: Alert and oriented.  Eyes: Conjunctivae are normal.  Pupils are equal and reactive. Head: Atraumatic. Nose: No congestion/rhinnorhea. Mouth/Throat: Patient is wearing a mask. Neck: No stridor.  No meningeal signs.   Cardiovascular: Normal rate, regular rhythm. Good peripheral circulation. Respiratory: Normal respiratory effort.  No retractions. Gastrointestinal: Soft and nontender. No distention.  Musculoskeletal: No lower extremity tenderness nor edema. No gross deformities of extremities. Neurologic:  Normal speech and language. No gross focal neurologic deficits are appreciated.  Skin:  Skin is warm, dry and intact. Psychiatric: Mood and affect are normal. Speech and behavior are normal.  ____________________________________________    EKG  No indication for emergent EKG tonight but I reviewed her prior EKG and she had no evidence of QT prolongation. ____________________________________________   INITIAL IMPRESSION / MDM / ASSESSMENT AND PLAN / ED COURSE  As part of my medical decision  making, I reviewed the following data within the electronic MEDICAL RECORD NUMBER Nursing notes reviewed and incorporated, Old EKG reviewed, Old chart reviewed, and Notes from prior ED visits   Differential diagnosis includes, but is not limited to, migraine, other nonspecific headache such as cluster headache or tension headache, intracranial hemorrhage.  Given the patient's history, I strongly suspect she is having a migraine, particularly given that she ran out of her Imitrex and the similarities to her usual episodes including aura, photophobia, etc.  I discussed it with her and her husband and we agreed to treat empirically for migraine and then reassess to determine if she needs additional imaging or lab work.  Current treatment plan is for droperidol 2.5 mg IV, Benadryl 12.5 mg IV, Toradol 15 mg IV, Decadron 10 mg IV, and a 500 mL normal saline bolus.     Clinical Course as of 02/19/21 0421  Thu Feb 19, 2021  [id number] Patient says her pain has resolved and she is ready to go home.  I refilled her Imitrex prescription and she and her husband are comfortable with the plan for discharge and outpatient follow-up.  I gave my usual and customary return precautions. [CF]    Clinical Course User Index [CF] Loleta Rose, MD     ____________________________________________  FINAL CLINICAL IMPRESSION(S) / ED DIAGNOSES  Final diagnoses:  Migraine without aura and responsive to treatment     MEDICATIONS GIVEN DURING THIS VISIT:  Medications  droperidol (INAPSINE) 2.5 MG/ML injection 2.5 mg (2.5 mg Intravenous Given 02/19/21 0222)  diphenhydrAMINE (BENADRYL) injection 12.5 mg (12.5 mg Intravenous Given 02/19/21 0221)  ketorolac (TORADOL) 30 MG/ML injection 15 mg (15 mg Intravenous Given 02/19/21 0222)  dexamethasone (DECADRON) injection 10 mg (10 mg Intravenous Given 02/19/21 0222)  sodium chloride 0.9 % bolus 500 mL (500 mLs Intravenous New Bag/Given 02/19/21 0226)     ED Discharge Orders           Ordered    SUMAtriptan (IMITREX) 50 MG tablet  Once PRN        02/19/21 0418             Note:  This document was prepared using Dragon voice recognition software and may include unintentional dictation errors.   Loleta Rose, MD 02/19/21 [phone]

## 2021-02-19 NOTE — Discharge Instructions (Signed)

## 2021-02-23 ENCOUNTER — Encounter: Payer: Commercial Managed Care - PPO | Admitting: Internal Medicine

## 2021-02-24 ENCOUNTER — Other Ambulatory Visit: Payer: Self-pay

## 2021-02-24 ENCOUNTER — Encounter: Payer: Self-pay | Admitting: Internal Medicine

## 2021-02-24 ENCOUNTER — Ambulatory Visit (INDEPENDENT_AMBULATORY_CARE_PROVIDER_SITE_OTHER): Payer: Commercial Managed Care - PPO | Admitting: Internal Medicine

## 2021-02-24 VITALS — BP 94/68 | HR 78 | Temp 97.5°F | Resp 17 | Ht 66.0 in | Wt 165.0 lb

## 2021-02-24 DIAGNOSIS — Z23 Encounter for immunization: Secondary | ICD-10-CM | POA: Diagnosis not present

## 2021-02-24 DIAGNOSIS — F419 Anxiety disorder, unspecified: Secondary | ICD-10-CM | POA: Diagnosis not present

## 2021-02-24 DIAGNOSIS — I10 Essential (primary) hypertension: Secondary | ICD-10-CM

## 2021-02-24 DIAGNOSIS — G43009 Migraine without aura, not intractable, without status migrainosus: Secondary | ICD-10-CM | POA: Diagnosis not present

## 2021-02-24 DIAGNOSIS — E663 Overweight: Secondary | ICD-10-CM

## 2021-02-24 DIAGNOSIS — Z6826 Body mass index (BMI) 26.0-26.9, adult: Secondary | ICD-10-CM

## 2021-02-24 DIAGNOSIS — D509 Iron deficiency anemia, unspecified: Secondary | ICD-10-CM

## 2021-02-24 MED ORDER — VENLAFAXINE HCL ER 75 MG PO CP24: 75.0000 mg | ORAL_CAPSULE | Freq: Every day | ORAL | 1 refills | Status: DC

## 2021-02-24 NOTE — Progress Notes (Signed)
Subjective:    Patient ID: Jaclyn Mcknight, female    DOB: 12-15-1970, 50 y.o.   MRN: [id number]  HPI  Pt presents to the clinic today for follow up of chronic conditions. She is establishing care with me today, transferring care from Danielle Rankin, NP.  HTN: Her BP today is 94/68. She is taking Lisinopril-HCT as prescribed. ECG from 11/2017 reviewed.  Migraines: These occur 1 x month. Triggered by certain lights. She takes Imitrex as needed with good relief of symptoms. She does not follow with neurology.  Anxiety: Chronic, managed on Venlafaxine.  She does not feel like her anxiety is well controlled on her current dose.  She is not currently seeing a therapist. She denies depression, SI/HI.  Anemia: Her last H/H was 11.4/34.4, 2016. She denies chronic blood loss. She is not taking any oral iron.  Review of Systems     Past Medical History:  Diagnosis Date   Anxiety    Back pain at L4-L5 level    Gravida 3 para 3    Hypertension    Migraines    MIGRAINES   Seizures (HCC)    X1 IN 2016    Current Outpatient Medications  Medication Sig Dispense Refill   cyclobenzaprine (FLEXERIL) 5 MG tablet Take 1 tablet (5 mg total) by mouth 3 (three) times daily as needed for muscle spasms. 30 tablet 1   ibuprofen (ADVIL,MOTRIN) 200 MG tablet Take 800 mg by mouth every 8 (eight) hours as needed (for pain.).      lisinopril-hydrochlorothiazide (ZESTORETIC) 20-25 MG tablet Take 1 tablet by mouth daily. 15 tablet 0   SUMAtriptan (IMITREX) 50 MG tablet Take 0.5-1 tablets (25-50 mg total) by mouth once as needed for up to 1 dose for migraine. May repeat one dose in 2 hours if headache persists, for max dose 24 hours 12 tablet 2   venlafaxine XR (EFFEXOR XR) 37.5 MG 24 hr capsule Take 1 capsule (37.5 mg total) by mouth daily with breakfast. 90 capsule 1   No current facility-administered medications for this visit.    No Known Allergies  Family History  Problem Relation Age of Onset    Hypertension Mother    Diabetes Mother    Hypertension Sister    Hypertension Maternal Aunt    Diabetes Maternal Aunt    Hypertension Maternal Grandmother    Diabetes Maternal Grandmother     Social History   Socioeconomic History   Marital status: Married    Spouse name: Not on file   Number of children: Not on file   Years of education: Not on file   Highest education level: Not on file  Occupational History   Not on file  Tobacco Use   Smoking status: Never   Smokeless tobacco: Never  Vaping Use   Vaping Use: Never used  Substance and Sexual Activity   Alcohol use: Yes    Alcohol/week: 1.0 standard drink    Types: 1 Glasses of wine per week    Comment: 2 glasses wine per month.   Drug use: Not Currently    Types: Marijuana    Comment: OCC   Sexual activity: Yes    Comment: tubal ligation  Other Topics Concern   Not on file  Social History Narrative   Not on file   Social Determinants of Health   Financial Resource Strain: Not on file  Food Insecurity: Not on file  Transportation Needs: Not on file  Physical Activity: Not on file  Stress: Not on file  Social Connections: Not on file  Intimate Partner Violence: Not on file     Constitutional: Pt reports intermittent headaches. Denies fever, malaise, fatigue, or abrupt weight changes.  HEENT: Denies eye pain, eye redness, ear pain, ringing in the ears, wax buildup, runny nose, nasal congestion, bloody nose, or sore throat. Respiratory: Denies difficulty breathing, shortness of breath, cough or sputum production.   Cardiovascular: Denies chest pain, chest tightness, palpitations or swelling in the hands or feet.  Gastrointestinal: Denies abdominal pain, bloating, constipation, diarrhea or blood in the stool.  GU: Denies urgency, frequency, pain with urination, burning sensation, blood in urine, odor or discharge. Musculoskeletal: Denies decrease in range of motion, difficulty with gait, muscle pain or joint  pain and swelling.  Skin: Denies redness, rashes, lesions or ulcercations.  Neurological: Denies dizziness, difficulty with memory, difficulty with speech or problems with balance and coordination.  Psych: Pt has a history of anxiety. Denies depression, SI/HI.  No other specific complaints in a complete review of systems (except as listed in HPI above).  Objective:   Physical Exam BP 94/68 (BP Location: Right Arm, Patient Position: Sitting, Cuff Size: Normal)   Pulse 78   Temp (!) 97.5 F (36.4 C) (Temporal)   Resp 17   Ht 5\' 6"  (1.676 m)   Wt 165 lb (74.8 kg)   LMP 11/15/2017 (Approximate)   SpO2 100%   BMI 26.63 kg/m   Wt Readings from Last 3 Encounters:  02/18/21 146 lb (66.2 kg)  05/01/20 160 lb 3.2 oz (72.7 kg)  11/30/19 165 lb (74.8 kg)    General: Appears her stated age, overweight in NAD. Skin: Warm, dry and intact. HEENT: Head: normal shape and size; Eyes: EOMs intact;  Cardiovascular: Normal rate and rhythm. S1,S2 noted.  No murmur, rubs or gallops noted. No JVD or BLE edema.  Pulmonary/Chest: Normal effort and positive vesicular breath sounds. No respiratory distress. No wheezes, rales or ronchi noted.  Neurological: Alert and oriented.  Psychiatric: Mood and affect normal. Behavior is normal. Judgment and thought content normal.    BMET    Component Value Date/Time   NA 140 11/28/2017 0833   K 4.6 11/28/2017 0833   CL 103 11/28/2017 0833   CO2 31 11/28/2017 0833   GLUCOSE 82 11/28/2017 0833   BUN 13 11/28/2017 0833   CREATININE 0.94 11/28/2017 0833   CALCIUM 9.3 11/28/2017 0833   GFRNONAA >60 05/30/2015 1806   GFRAA >60 05/30/2015 1806    Lipid Panel  No results found for: CHOL, TRIG, HDL, CHOLHDL, VLDL, LDLCALC  CBC    Component Value Date/Time   WBC 6.2 05/30/2015 1806   RBC 3.75 (L) 05/30/2015 1806   HGB 11.4 (L) 05/30/2015 1806   HCT 34.4 (L) 05/30/2015 1806   PLT 328 05/30/2015 1806   MCV 91.8 05/30/2015 1806   MCH 30.4 05/30/2015 1806    MCHC 33.1 05/30/2015 1806   RDW 14.5 05/30/2015 1806   LYMPHSABS 2.1 05/30/2015 1806   MONOABS 0.6 05/30/2015 1806   EOSABS 0.3 05/30/2015 1806   BASOSABS 0.0 05/30/2015 1806    Hgb A1C No results found for: HGBA1C     Assessment & Plan:   06/01/2015, NP This visit occurred during the SARS-CoV-2 public health emergency.  Safety protocols were in place, including screening questions prior to the visit, additional usage of staff PPE, and extensive cleaning of exam room while observing appropriate contact time as indicated for disinfecting solutions.

## 2021-02-25 DIAGNOSIS — D649 Anemia, unspecified: Secondary | ICD-10-CM | POA: Insufficient documentation

## 2021-02-25 DIAGNOSIS — E663 Overweight: Secondary | ICD-10-CM | POA: Insufficient documentation

## 2021-02-25 DIAGNOSIS — Z6828 Body mass index (BMI) 28.0-28.9, adult: Secondary | ICD-10-CM | POA: Insufficient documentation

## 2021-02-25 LAB — CBC
HCT: 40.7 % (ref 35.0–45.0)
Hemoglobin: 13.5 g/dL (ref 11.7–15.5)
MCH: 30.5 pg (ref 27.0–33.0)
MCHC: 33.2 g/dL (ref 32.0–36.0)
MCV: 91.9 fL (ref 80.0–100.0)
MPV: 9.4 fL (ref 7.5–12.5)
Platelets: 334 10*3/uL (ref 140–400)
RBC: 4.43 10*6/uL (ref 3.80–5.10)
RDW: 12.8 % (ref 11.0–15.0)
WBC: 7.2 10*3/uL (ref 3.8–10.8)

## 2021-02-25 LAB — BASIC METABOLIC PANEL
BUN: 15 mg/dL (ref 7–25)
CO2: 27 mmol/L (ref 20–32)
Calcium: 9.4 mg/dL (ref 8.6–10.4)
Chloride: 105 mmol/L (ref 98–110)
Creat: 0.88 mg/dL (ref 0.50–1.03)
Glucose, Bld: 120 mg/dL (ref 65–139)
Potassium: 3.6 mmol/L (ref 3.5–5.3)
Sodium: 142 mmol/L (ref 135–146)

## 2021-02-25 NOTE — Assessment & Plan Note (Signed)
Encourage diet and exercise for weight loss 

## 2021-02-25 NOTE — Assessment & Plan Note (Addendum)
Continue Lisinopril HCT Reinforced DASH diet and exercise for weight loss Bmet today Will monitor

## 2021-02-25 NOTE — Assessment & Plan Note (Signed)
Deteriorated Will increase Venlafaxine to 75 mg daily Support offered

## 2021-02-25 NOTE — Assessment & Plan Note (Signed)
CBC today.  

## 2021-02-25 NOTE — Patient Instructions (Signed)

## 2021-02-25 NOTE — Assessment & Plan Note (Signed)
Try to avoid triggers Continue Imitrex as needed Will monitor

## 2021-03-06 ENCOUNTER — Other Ambulatory Visit: Payer: Self-pay | Admitting: Internal Medicine

## 2021-03-06 DIAGNOSIS — I10 Essential (primary) hypertension: Secondary | ICD-10-CM

## 2021-03-11 MED ORDER — LISINOPRIL-HYDROCHLOROTHIAZIDE 20-25 MG PO TABS
1.0000 | ORAL_TABLET | Freq: Every day | ORAL | 3 refills | Status: DC
Start: 2021-03-11 — End: 2022-05-20

## 2021-04-09 ENCOUNTER — Ambulatory Visit: Payer: Commercial Managed Care - PPO | Admitting: Family Medicine

## 2021-04-09 ENCOUNTER — Encounter: Payer: Self-pay | Admitting: Family Medicine

## 2021-04-09 ENCOUNTER — Other Ambulatory Visit: Payer: Self-pay

## 2021-04-09 VITALS — BP 135/106 | HR 80 | Temp 98.4°F | Ht 66.0 in | Wt 167.6 lb

## 2021-04-09 DIAGNOSIS — B349 Viral infection, unspecified: Secondary | ICD-10-CM | POA: Diagnosis not present

## 2021-04-09 DIAGNOSIS — J101 Influenza due to other identified influenza virus with other respiratory manifestations: Secondary | ICD-10-CM

## 2021-04-09 DIAGNOSIS — R509 Fever, unspecified: Secondary | ICD-10-CM | POA: Diagnosis not present

## 2021-04-09 MED ORDER — BENZONATATE 100 MG PO CAPS: 100.0000 mg | ORAL_CAPSULE | Freq: Three times a day (TID) | ORAL | 0 refills | Status: DC | PRN

## 2021-04-09 MED ORDER — OSELTAMIVIR PHOSPHATE 75 MG PO CAPS: 75.0000 mg | ORAL_CAPSULE | Freq: Two times a day (BID) | ORAL | 0 refills | Status: DC

## 2021-04-09 MED ORDER — PREDNISONE 10 MG PO TABS: ORAL_TABLET | ORAL | 0 refills | Status: DC

## 2021-04-09 MED ORDER — IPRATROPIUM BROMIDE 0.06 % NA SOLN: 2.0000 | Freq: Four times a day (QID) | NASAL | 0 refills | Status: DC

## 2021-04-09 NOTE — Patient Instructions (Addendum)
Thank you for coming to the office today.  Testing today for COVID Flu RSV  If Flu positive, then take Tamiflu.  Start Tessalon Perls take 1 capsule up to 3 times a day as needed for cough Start Atrovent nasal spray decongestant 2 sprays in each nostril up to 4 times daily for 7 days Start Prednisone taper for cough / coverage for covid as well.  Please schedule a Follow-up Appointment to: Return if symptoms worsen or fail to improve.  If you have any other questions or concerns, please feel free to call the office or send a message through MyChart. You may also schedule an earlier appointment if necessary.  Additionally, you may be receiving a survey about your experience at our office within a few days to 1 week by e-mail or mail. We value your feedback.  Saralyn Pilar, DO [facility], [place]

## 2021-04-09 NOTE — Progress Notes (Signed)
Subjective:    Patient ID: Jaclyn Mcknight, female    DOB: [birth year], 50 y.o.   MRN: [id number]  Jaclyn Mcknight is a 50 y.o. female presenting on 04/09/2021 for Cough and Nasal Congestion   HPI  Acute URI Viral Syndrome Sick contact, Grandson with viral symptoms. No confirm dx Her symptoms onset 2 days with sinus congestion drainage cough sore throat Admits Chills. But has not measured temp. Today no fever She works at Nursing home  Denies dyspnea,  nausea vomiting abdominal pain diarrhea  Health Maintenance: Covid vaccine but no booster. UTD Flu Shot already  Depression screen [facility] 2/9 02/24/2021 05/01/2020 11/30/2019  Decreased Interest 0 0 0  Down, Depressed, Hopeless 0 0 0  PHQ - 2 Score 0 0 0  Altered sleeping 1 3 0  Tired, decreased energy 0 0 0  Change in appetite 0 0 0  Feeling bad or failure about yourself  1 0 0  Trouble concentrating 0 0 0  Moving slowly or fidgety/restless 0 0 0  Suicidal thoughts 0 0 0  PHQ-9 Score 2 3 0  Difficult doing work/chores Not difficult at all Not difficult at all Not difficult at all    Social History   Tobacco Use   Smoking status: Never   Smokeless tobacco: Never  Vaping Use   Vaping Use: Never used  Substance Use Topics   Alcohol use: Yes    Alcohol/week: 1.0 standard drink    Types: 1 Glasses of wine per week    Comment: 2 glasses wine per month.   Drug use: Not Currently    Types: Marijuana    Comment: OCC    Review of Systems Per HPI unless specifically indicated above     Objective:    BP (!) 135/106   Pulse 80   Temp 98.4 F (36.9 C) (Oral)   Ht 5\' 6"  (1.676 m)   Wt 167 lb 9.6 oz (76 kg)   LMP 11/15/2017 (Approximate)   SpO2 100%   BMI 27.05 kg/m   Wt Readings from Last 3 Encounters:  04/09/21 167 lb 9.6 oz (76 kg)  02/24/21 165 lb (74.8 kg)  02/18/21 146 lb (66.2 kg)    Physical Exam Vitals and nursing note reviewed.  Constitutional:      General: She is not in acute distress.     Appearance: Normal appearance. She is well-developed. She is not diaphoretic.     Comments: Well-appearing, comfortable, cooperative  HENT:     Head: Normocephalic and atraumatic.  Eyes:     General:        Right eye: No discharge.        Left eye: No discharge.     Conjunctiva/sclera: Conjunctivae normal.  Neck:     Thyroid: No thyromegaly.  Cardiovascular:     Rate and Rhythm: Normal rate and regular rhythm.     Heart sounds: Normal heart sounds. No murmur heard. Pulmonary:     Effort: Pulmonary effort is normal. No respiratory distress.     Breath sounds: Normal breath sounds. No wheezing or rales.  Musculoskeletal:        General: Normal range of motion.     Cervical back: Normal range of motion and neck supple.  Lymphadenopathy:     Cervical: No cervical adenopathy.  Skin:    General: Skin is warm and dry.     Findings: No erythema or rash.  Neurological:     Mental Status: She is alert and  oriented to person, place, and time.  Psychiatric:        Mood and Affect: Mood normal.        Behavior: Behavior normal.        Thought Content: Thought content normal.     Comments: Well groomed, good eye contact, normal speech and thoughts   Results for orders placed or performed in visit on 02/24/21  CBC  Result Value Ref Range   WBC 7.2 3.8 - 10.8 Thousand/uL   RBC 4.43 3.80 - 5.10 Million/uL   Hemoglobin 13.5 11.7 - 15.5 g/dL   HCT 24.2 68.3 - 41.9 %   MCV 91.9 80.0 - 100.0 fL   MCH 30.5 27.0 - 33.0 pg   MCHC 33.2 32.0 - 36.0 g/dL   RDW 62.2 29.7 - 98.9 %   Platelets 334 140 - 400 Thousand/uL   MPV 9.4 7.5 - 12.5 fL  Basic metabolic panel  Result Value Ref Range   Glucose, Bld 120 65 - 139 mg/dL   BUN 15 7 - 25 mg/dL   Creat 2.11 9.41 - 7.40 mg/dL   BUN/Creatinine Ratio NOT APPLICABLE 6 - 22 (calc)   Sodium 142 135 - 146 mmol/L   Potassium 3.6 3.5 - 5.3 mmol/L   Chloride 105 98 - 110 mmol/L   CO2 27 20 - 32 mmol/L   Calcium 9.4 8.6 - 10.4 mg/dL      Assessment  & Plan:   Problem List Items Addressed This Visit   None Visit Diagnoses     Acute viral syndrome    -  Primary   Relevant Medications   benzonatate (TESSALON) 100 MG capsule   ipratropium (ATROVENT) 0.06 % nasal spray   predniSONE (DELTASONE) 10 MG tablet   oseltamivir (TAMIFLU) 75 MG capsule   Other Relevant Orders   COVID-19, Flu A+B and RSV   Fever and chills       Relevant Medications   oseltamivir (TAMIFLU) 75 MG capsule   Other Relevant Orders   COVID-19, Flu A+B and RSV   Influenza A       Relevant Medications   oseltamivir (TAMIFLU) 75 MG capsule       Viral Syndrome acute No covid booster but primary vax and has had flu shot Healthcare setting Sick contact Testing today for COVID Flu RSV  If Flu positive, then take Tamiflu. Printed rx. If not available, - notify office can switch to [employer] take 1 capsule up to 3 times a day as needed for cough Start Atrovent nasal spray decongestant 2 sprays in each nostril up to 4 times daily for 7 days Start Prednisone taper for cough / coverage for covid as well.  Meds ordered this encounter  Medications   DISCONTD: benzonatate (TESSALON) 100 MG capsule    Sig: Take 1 capsule (100 mg total) by mouth 3 (three) times daily as needed for cough.    Dispense:  30 capsule    Refill:  0   benzonatate (TESSALON) 100 MG capsule    Sig: Take 1 capsule (100 mg total) by mouth 3 (three) times daily as needed for cough.    Dispense:  30 capsule    Refill:  0   ipratropium (ATROVENT) 0.06 % nasal spray    Sig: Place 2 sprays into both nostrils 4 (four) times daily. For up to 5-7 days then stop.    Dispense:  15 mL    Refill:  0   predniSONE (DELTASONE) 10  MG tablet    Sig: Take 6 tabs with breakfast Day 1, 5 tabs Day 2, 4 tabs Day 3, 3 tabs Day 4, 2 tabs Day 5, 1 tab Day 6.    Dispense:  21 tablet    Refill:  0   oseltamivir (TAMIFLU) 75 MG capsule    Sig: Take 1 capsule (75 mg total) by mouth 2 (two)  times daily. For 5 days    Dispense:  10 capsule    Refill:  0      Follow up plan: Return if symptoms worsen or fail to improve.   Saralyn Pilar, DO [facility] 04/09/2021, 9:51 AM

## 2021-04-10 LAB — SPECIMEN STATUS REPORT

## 2021-04-10 LAB — COVID-19, FLU A+B AND RSV
Influenza A, NAA: NOT DETECTED
Influenza B, NAA: NOT DETECTED
RSV, NAA: NOT DETECTED
SARS-CoV-2, NAA: NOT DETECTED

## 2022-02-19 ENCOUNTER — Ambulatory Visit: Payer: Self-pay | Admitting: *Deleted

## 2022-02-19 NOTE — Telephone Encounter (Signed)
  Chief Complaint: migraine headache worsening  Symptoms: frontal area headache , constant , blurred vision reported. Nausea without vomiting. Reports she has taken prescribed imitrex and BC without relief.  Frequency: 4 days ago  Pertinent Negatives: Patient denies double vision no N/T to face or extremities. No dizziness  Disposition: [] ED /[x] Urgent Care (no appt availability in office) / [] Appointment(In office/virtual)/ []  [facility]/ [] Home Care/ [x] Refused Recommended Disposition /[] [facility]/ []  Follow-up with PCP Additional Notes:   Recommended UC . Patient requesting to f/u with PCP. No available appt today . Patient is going to call pharmacy out of imitrex now. Please advise. Requesting a call back today . Patient reports if sx worsen she will go to UC/ED.   Reason for Disposition  [1] SEVERE headache (e.g., excruciating) AND [2] not improved after 2 hours of pain medicine  Answer Assessment - Initial Assessment Questions 1. LOCATION: "Where does it hurt?"      Frontal area  2. ONSET: "When did the headache start?" (Minutes, hours or days)      4 days ago  3. PATTERN: "Does the pain come and go, or has it been constant since it started?"     Constant  4. SEVERITY: "How bad is the pain?" and "What does it keep you from doing?"  (e.g., Scale 1-10; mild, moderate, or severe)   - MILD (1-3): doesn't interfere with normal activities    - MODERATE (4-7): interferes with normal activities or awakens from sleep    - SEVERE (8-10): excruciating pain, unable to do any normal activities        Not sleeping severe pain "8" 5. RECURRENT SYMPTOM: "Have you ever had headaches before?" If Yes, ask: "When was the last time?" and "What happened that time?"      Took imitrex and BC with out any relief  6. CAUSE: "What do you think is causing the headache?"     migraine 7. MIGRAINE: "Have you been diagnosed with migraine headaches?" If Yes, ask: "Is this headache  similar?"      Yes yes similar but more intense  8. HEAD INJURY: "Has there been any recent injury to the head?"      na 9. OTHER SYMPTOMS: "Do you have any other symptoms?" (fever, stiff neck, eye pain, sore throat, cold symptoms)     Stiff neck on initial pain 4 days  ago, now gone aura eye pain blurred vision nausea no vomiting at this time  10. PREGNANCY: "Is there any chance you are pregnant?" "When was your last menstrual period?"       na  Protocols used: [facility]

## 2022-02-19 NOTE — Telephone Encounter (Signed)
Advise pt that [facility] did not have opening this afternoon and will need to be seen at an Urgent Care.   Thanks,   -Mickel Baas

## 2022-05-16 ENCOUNTER — Telehealth: Payer: Self-pay | Admitting: Internal Medicine

## 2022-05-16 DIAGNOSIS — I10 Essential (primary) hypertension: Secondary | ICD-10-CM

## 2022-05-17 NOTE — Telephone Encounter (Signed)
Requested medication (s) are due for refill today: yes  Requested medication (s) are on the active medication list: yes  Last refill:  03/11/21 #90 3 RF  Future visit scheduled: no  Notes to clinic:  overdue labs   Requested Prescriptions  Pending Prescriptions Disp Refills   lisinopril-hydrochlorothiazide (ZESTORETIC) 20-25 MG tablet [Pharmacy Med Name: Lisinopril-hydroCHLOROthiazide 20-25 MG Oral Tablet] 30 tablet 0    Sig: Take 1 tablet by mouth once daily     Cardiovascular:  ACEI + Diuretic Combos Failed - 05/16/2022 11:38 AM      Failed - Na in normal range and within 180 days    Sodium  Date Value Ref Range Status  02/24/2021 142 135 - 146 mmol/L Final         Failed - K in normal range and within 180 days    Potassium  Date Value Ref Range Status  02/24/2021 3.6 3.5 - 5.3 mmol/L Final         Failed - Cr in normal range and within 180 days    Creat  Date Value Ref Range Status  02/24/2021 0.88 0.50 - 1.03 mg/dL Final         Failed - eGFR is 30 or above and within 180 days    GFR calc Af Amer  Date Value Ref Range Status  05/30/2015 >60 >60 mL/min Final    Comment:    (NOTE) The eGFR has been calculated using the CKD EPI equation. This calculation has not been validated in all clinical situations. eGFR's persistently <60 mL/min signify possible Chronic Kidney Disease.    GFR calc non Af Amer  Date Value Ref Range Status  05/30/2015 >60 >60 mL/min Final         Failed - Last BP in normal range    BP Readings from Last 1 Encounters:  04/09/21 (!) 135/106         Failed - Valid encounter within last 6 months    Recent Outpatient Visits           1 year ago Acute viral syndrome   [place], Devonne Doughty, DO   1 year ago Essential (primary) hypertension   [facility] [place], Coralie Keens, NP   2 years ago Essential (primary) hypertension   [place], FNP   2 years ago  Essential (primary) hypertension   [place], FNP   2 years ago Acute left-sided low back pain with left-sided sciatica   [facility], Lupita Raider, [place]              Passed - Patient is not pregnant

## 2022-05-20 ENCOUNTER — Other Ambulatory Visit: Payer: Self-pay | Admitting: Internal Medicine

## 2022-05-20 DIAGNOSIS — I10 Essential (primary) hypertension: Secondary | ICD-10-CM

## 2022-05-20 NOTE — Telephone Encounter (Signed)
Scheduled an appointment for patient for 05/25/2022 @9:40 am. Patient is out of medication and is wanting to see if some can be called in for her until her appointment. lisinopril-hydrochlorothiazide (ZESTORETIC) 20-25 MG tablet   Please advise.  

## 2022-05-20 NOTE — Telephone Encounter (Signed)
Medication refilled for 30 days.

## 2022-05-20 NOTE — Telephone Encounter (Deleted)
Scheduled an appointment for patient for 05/25/2022 @9 :40 am. Patient is out of medication and is wanting to see if some can be called in for her until her appointment. lisinopril-hydrochlorothiazide (ZESTORETIC) 20-25 MG tablet   Please advise.

## 2022-05-21 ENCOUNTER — Other Ambulatory Visit: Payer: Self-pay | Admitting: Internal Medicine

## 2022-05-21 DIAGNOSIS — I10 Essential (primary) hypertension: Secondary | ICD-10-CM

## 2022-05-21 NOTE — Telephone Encounter (Signed)
Resending as pharmacy didn't get yesterday  Requested Prescriptions  Pending Prescriptions Disp Refills   lisinopril-hydrochlorothiazide (ZESTORETIC) 20-25 MG tablet [Pharmacy Med Name: Lisinopril-hydroCHLOROthiazide 20-25 MG Oral Tablet] 30 tablet 0    Sig: Take 1 tablet by mouth once daily     Cardiovascular:  ACEI + Diuretic Combos Failed - 05/21/2022  9:29 AM      Failed - Na in normal range and within 180 days    Sodium  Date Value Ref Range Status  02/24/2021 142 135 - 146 mmol/L Final         Failed - K in normal range and within 180 days    Potassium  Date Value Ref Range Status  02/24/2021 3.6 3.5 - 5.3 mmol/L Final         Failed - Cr in normal range and within 180 days    Creat  Date Value Ref Range Status  02/24/2021 0.88 0.50 - 1.03 mg/dL Final         Failed - eGFR is 30 or above and within 180 days    GFR calc Af Amer  Date Value Ref Range Status  05/30/2015 >60 >60 mL/min Final    Comment:    (NOTE) The eGFR has been calculated using the CKD EPI equation. This calculation has not been validated in all clinical situations. eGFR's persistently <60 mL/min signify possible Chronic Kidney Disease.    GFR calc non Af Amer  Date Value Ref Range Status  05/30/2015 >60 >60 mL/min Final         Failed - Last BP in normal range    BP Readings from Last 1 Encounters:  04/09/21 (!) 135/106         Failed - Valid encounter within last 6 months    Recent Outpatient Visits           1 year ago Acute viral syndrome   [place], Devonne Doughty, DO   1 year ago Essential (primary) hypertension   [facility] [place], Coralie Keens, NP   2 years ago Essential (primary) hypertension   [place], FNP   2 years ago Essential (primary) hypertension   [place], FNP   2 years ago Acute left-sided low back pain with left-sided sciatica   [facility], Lupita Raider, FNP       Future Appointments             In 4 days Garnette Gunner, Coralie Keens, NP [facility], [place] - Patient is not pregnant

## 2022-05-25 ENCOUNTER — Encounter: Payer: Self-pay | Admitting: Internal Medicine

## 2022-05-25 ENCOUNTER — Ambulatory Visit (INDEPENDENT_AMBULATORY_CARE_PROVIDER_SITE_OTHER): Payer: Commercial Managed Care - PPO | Admitting: Internal Medicine

## 2022-05-25 ENCOUNTER — Other Ambulatory Visit (HOSPITAL_COMMUNITY)
Admission: RE | Admit: 2022-05-25 | Discharge: 2022-05-25 | Disposition: A | Discharge status: Home / Self Care | Payer: Commercial Managed Care - PPO | Source: Ambulatory Visit | Attending: Internal Medicine | Admitting: Internal Medicine

## 2022-05-25 VITALS — BP 160/103 | HR 75 | Ht 66.0 in | Wt 170.0 lb

## 2022-05-25 DIAGNOSIS — Z1231 Encounter for screening mammogram for malignant neoplasm of breast: Secondary | ICD-10-CM

## 2022-05-25 DIAGNOSIS — Z6827 Body mass index (BMI) 27.0-27.9, adult: Secondary | ICD-10-CM

## 2022-05-25 DIAGNOSIS — Z0001 Encounter for general adult medical examination with abnormal findings: Secondary | ICD-10-CM | POA: Diagnosis not present

## 2022-05-25 DIAGNOSIS — Z1211 Encounter for screening for malignant neoplasm of colon: Secondary | ICD-10-CM

## 2022-05-25 DIAGNOSIS — E663 Overweight: Secondary | ICD-10-CM

## 2022-05-25 DIAGNOSIS — I10 Essential (primary) hypertension: Secondary | ICD-10-CM

## 2022-05-25 DIAGNOSIS — Z124 Encounter for screening for malignant neoplasm of cervix: Secondary | ICD-10-CM | POA: Insufficient documentation

## 2022-05-25 DIAGNOSIS — Z114 Encounter for screening for human immunodeficiency virus [HIV]: Secondary | ICD-10-CM

## 2022-05-25 DIAGNOSIS — R7309 Other abnormal glucose: Secondary | ICD-10-CM | POA: Diagnosis not present

## 2022-05-25 DIAGNOSIS — Z1159 Encounter for screening for other viral diseases: Secondary | ICD-10-CM

## 2022-05-25 DIAGNOSIS — G43009 Migraine without aura, not intractable, without status migrainosus: Secondary | ICD-10-CM

## 2022-05-25 MED ORDER — LISINOPRIL-HYDROCHLOROTHIAZIDE 20-25 MG PO TABS: 1.0000 | ORAL_TABLET | Freq: Every day | ORAL | 1 refills | Status: DC

## 2022-05-25 MED ORDER — SUMATRIPTAN SUCCINATE 50 MG PO TABS: 25.0000 mg | ORAL_TABLET | Freq: Once | ORAL | 5 refills | Status: DC | PRN

## 2022-05-25 NOTE — Progress Notes (Signed)
Subjective:    Patient ID: Jaclyn Mcknight, female    DOB: August 25, 1970, 51 y.o.   MRN: [id number]  HPI  Patient presents to clinic today for annual exam. Of note, her BP today is 169/105. She has been out of her Lisinopril for 1 week.  Flu: 01/2021 Tetanus: 03/2012 COVID: Moderna x 2 Shingrix: Negative Pap smear: > 10 years ago Mammogram: 10/2017 Colon screening: never Vision screening: as needed Dentist: biannually  Diet: She does eat meat. She consumes fruits and veggies. She does eat some fried foods. She drinks mostly coffee, some water. Exercise: Walking  Review of Systems     Past Medical History:  Diagnosis Date   Anxiety    Back pain at L4-L5 level    Gravida 3 para 3    Hypertension    Migraines    MIGRAINES   Seizures ([facility])    X1 IN 2016    Current Outpatient Medications  Medication Sig Dispense Refill   benzonatate (TESSALON) 100 MG capsule Take 1 capsule (100 mg total) by mouth 3 (three) times daily as needed for cough. 30 capsule 0   cyclobenzaprine (FLEXERIL) 5 MG tablet Take 1 tablet (5 mg total) by mouth 3 (three) times daily as needed for muscle spasms. 30 tablet 1   ibuprofen (ADVIL,MOTRIN) 200 MG tablet Take 800 mg by mouth every 8 (eight) hours as needed (for pain.).      ipratropium (ATROVENT) 0.06 % nasal spray Place 2 sprays into both nostrils 4 (four) times daily. For up to 5-7 days then stop. 15 mL 0   lisinopril-hydrochlorothiazide (ZESTORETIC) 20-25 MG tablet Take 1 tablet by mouth once daily 30 tablet 0   oseltamivir (TAMIFLU) 75 MG capsule Take 1 capsule (75 mg total) by mouth 2 (two) times daily. For 5 days 10 capsule 0   predniSONE (DELTASONE) 10 MG tablet Take 6 tabs with breakfast Day 1, 5 tabs Day 2, 4 tabs Day 3, 3 tabs Day 4, 2 tabs Day 5, 1 tab Day 6. 21 tablet 0   SUMAtriptan (IMITREX) 50 MG tablet Take 0.5-1 tablets (25-50 mg total) by mouth once as needed for up to 1 dose for migraine. May repeat one dose in 2 hours if headache  persists, for max dose 24 hours 12 tablet 2   venlafaxine XR (EFFEXOR XR) 75 MG 24 hr capsule Take 1 capsule (75 mg total) by mouth daily with breakfast. 90 capsule 1   No current facility-administered medications for this visit.    No Known Allergies  Family History  Problem Relation Age of Onset   Hypertension Mother    Diabetes Mother    Hypertension Sister    Hypertension Maternal Aunt    Diabetes Maternal Aunt    Hypertension Maternal Grandmother    Diabetes Maternal Grandmother     Social History   Socioeconomic History   Marital status: Married    Spouse name: Not on file   Number of children: Not on file   Years of education: Not on file   Highest education level: Not on file  Occupational History   Not on file  Tobacco Use   Smoking status: Never   Smokeless tobacco: Never  Vaping Use   Vaping Use: Never used  Substance and Sexual Activity   Alcohol use: Yes    Alcohol/week: 1.0 standard drink of alcohol    Types: 1 Glasses of wine per week    Comment: 2 glasses wine per month.   Drug  use: Not Currently    Types: Marijuana    Comment: OCC   Sexual activity: Yes    Comment: tubal ligation  Other Topics Concern   Not on file  Social History Narrative   Not on file   Social Determinants of Health   Financial Resource Strain: Not on file  Food Insecurity: Not on file  Transportation Needs: Not on file  Physical Activity: Not on file  Stress: Not on file  Social Connections: Not on file  Intimate Partner Violence: Not on file     Constitutional: Patient reports intermittent headaches.  Denies fever, malaise, fatigue, or abrupt weight changes.  HEENT: Denies eye pain, eye redness, ear pain, ringing in the ears, wax buildup, runny nose, nasal congestion, bloody nose, or sore throat. Respiratory: Denies difficulty breathing, shortness of breath, cough or sputum production.   Cardiovascular: Denies chest pain, chest tightness, palpitations or swelling  in the hands or feet.  Gastrointestinal: Denies abdominal pain, bloating, constipation, diarrhea or blood in the stool.  GU: Denies urgency, frequency, pain with urination, burning sensation, blood in urine, odor or discharge. Musculoskeletal: Denies decrease in range of motion, difficulty with gait, muscle pain or joint pain and swelling.  Skin: Denies redness, rashes, lesions or ulcercations.  Neurological: Denies dizziness, difficulty with memory, difficulty with speech or problems with balance and coordination.  Psych: Patient reports anxiety.  Denies depression, SI/HI.  No other specific complaints in a complete review of systems (except as listed in HPI above).  Objective:   Physical Exam  BP (!) 169/105   Pulse 75   Ht _0  (1.676 m)   Wt 170 lb (77.1 kg)   LMP 11/15/2017 (Approximate)   SpO2 100%   BMI 27.44 kg/m   Wt Readings from Last 3 Encounters:  04/09/21 167 lb 9.6 oz (76 kg)  02/24/21 165 lb (74.8 kg)  02/18/21 146 lb (66.2 kg)    General: Appears her stated age, overweight,  in NAD. Skin: Warm, dry and intact. No rashes noted. HEENT: Head: normal shape and size; Eyes: sclera white, no icterus, conjunctiva pink, PERRLA and EOMs intact;  Neck:  Neck supple, trachea midline. No masses, lumps or thyromegaly present.  Cardiovascular: Normal rate and rhythm. S1,S2 noted.  No murmur, rubs or gallops noted. No JVD or BLE edema. No carotid bruits noted. Pulmonary/Chest: Normal effort and positive vesicular breath sounds. No respiratory distress. No wheezes, rales or ronchi noted.  Abdomen: Normal bowel sounds.  GU: Normal female anatomy.  Cervix friable.  No CMT.  Adnexa nonpalpable. Musculoskeletal: Strength 5/5 BUE/BLE.  No difficulty with gait.  Neurological: Alert and oriented. Cranial nerves II-XII grossly intact. Coordination normal.  Psychiatric: Mood and affect normal. Behavior is normal. Judgment and thought content normal.    BMET    Component Value  Date/Time   NA 142 02/24/2021 1600   K 3.6 02/24/2021 1600   CL 105 02/24/2021 1600   CO2 27 02/24/2021 1600   GLUCOSE 120 02/24/2021 1600   BUN 15 02/24/2021 1600   CREATININE 0.88 02/24/2021 1600   CALCIUM 9.4 02/24/2021 1600   GFRNONAA >60 05/30/2015 1806   GFRAA >60 05/30/2015 1806    Lipid Panel  No results found for: "CHOL", "TRIG", "HDL", "CHOLHDL", "VLDL", "LDLCALC"  CBC    Component Value Date/Time   WBC 7.2 02/24/2021 1600   RBC 4.43 02/24/2021 1600   HGB 13.5 02/24/2021 1600   HCT 40.7 02/24/2021 1600   PLT 334 02/24/2021 1600  MCV 91.9 02/24/2021 1600   MCH 30.5 02/24/2021 1600   MCHC 33.2 02/24/2021 1600   RDW 12.8 02/24/2021 1600   LYMPHSABS 2.1 05/30/2015 1806   MONOABS 0.6 05/30/2015 1806   EOSABS 0.3 05/30/2015 1806   BASOSABS 0.0 05/30/2015 1806    Hgb A1C No results found for: "HGBA1C"         Assessment & Plan:   Preventative Health Maintenance:  She declines flu shot today She declines tetanus booster Encouraged her to get her COVID-booster Discussed Shingrix vaccine, she will check coverage with her insurance company and schedule a nurse visit if she would like to have this done Pap smear today, she declines STD screening Mammogram ordered-she will call to schedule Referral to GI for screening colonoscopy Encouraged her to consume a balanced diet and exercise regimen Advised her to see an eye doctor and dentist annually We will check CBC, c-Met, lipid, A1c, HIV and hep C today  RTC in 2 weeks, nurse visit BP check 6 months, follow-up chronic conditions Webb Silversmith, NP

## 2022-05-25 NOTE — Assessment & Plan Note (Signed)
Uncontrolled off meds Lisinopril HCT refilled today Reinforced DASH diet and exercise for weight loss

## 2022-05-25 NOTE — Assessment & Plan Note (Signed)
Encourage diet and exercise for weight loss 

## 2022-05-25 NOTE — Patient Instructions (Signed)
Health Maintenance for Postmenopausal Women Menopause is a normal process in which your ability to get pregnant comes to an end. This process happens slowly over many months or years, usually between the ages of 48 and 55. Menopause is complete when you have missed your menstrual period for 12 months. It is important to talk with your health care provider about some of the most common conditions that affect women after menopause (postmenopausal women). These include heart disease, cancer, and bone loss (osteoporosis). Adopting a healthy lifestyle and getting preventive care can help to promote your health and wellness. The actions you take can also lower your chances of developing some of these common conditions. What are the signs and symptoms of menopause? During menopause, you may have the following symptoms: Hot flashes. These can be moderate or severe. Night sweats. Decrease in sex drive. Mood swings. Headaches. Tiredness (fatigue). Irritability. Memory problems. Problems falling asleep or staying asleep. Talk with your health care provider about treatment options for your symptoms. Do I need hormone replacement therapy? Hormone replacement therapy is effective in treating symptoms that are caused by menopause, such as hot flashes and night sweats. Hormone replacement carries certain risks, especially as you become older. If you are thinking about using estrogen or estrogen with progestin, discuss the benefits and risks with your health care provider. How can I reduce my risk for heart disease and stroke? The risk of heart disease, heart attack, and stroke increases as you age. One of the causes may be a change in the body's hormones during menopause. This can affect how your body uses dietary fats, triglycerides, and cholesterol. Heart attack and stroke are medical emergencies. There are many things that you can do to help prevent heart disease and stroke. Watch your blood pressure High  blood pressure causes heart disease and increases the risk of stroke. This is more likely to develop in people who have high blood pressure readings or are overweight. Have your blood pressure checked: Every 3-5 years if you are 18-39 years of age. Every year if you are 40 years old or older. Eat a healthy diet  Eat a diet that includes plenty of vegetables, fruits, low-fat dairy products, and lean protein. Do not eat a lot of foods that are high in solid fats, added sugars, or sodium. Get regular exercise Get regular exercise. This is one of the most important things you can do for your health. Most adults should: Try to exercise for at least 150 minutes each week. The exercise should increase your heart rate and make you sweat (moderate-intensity exercise). Try to do strengthening exercises at least twice each week. Do these in addition to the moderate-intensity exercise. Spend less time sitting. Even light physical activity can be beneficial. Other tips Work with your health care provider to achieve or maintain a healthy weight. Do not use any products that contain nicotine or tobacco. These products include cigarettes, chewing tobacco, and vaping devices, such as e-cigarettes. If you need help quitting, ask your health care provider. Know your numbers. Ask your health care provider to check your cholesterol and your blood sugar (glucose). Continue to have your blood tested as directed by your health care provider. Do I need screening for cancer? Depending on your health history and family history, you may need to have cancer screenings at different stages of your life. This may include screening for: Breast cancer. Cervical cancer. Lung cancer. Colorectal cancer. What is my risk for osteoporosis? After menopause, you may be   at increased risk for osteoporosis. Osteoporosis is a condition in which bone destruction happens more quickly than new bone creation. To help prevent osteoporosis or  the bone fractures that can happen because of osteoporosis, you may take the following actions: If you are 19-50 years old, get at least 1,000 mg of calcium and at least 600 international units (IU) of vitamin D per day. If you are older than age 50 but younger than age 70, get at least 1,200 mg of calcium and at least 600 international units (IU) of vitamin D per day. If you are older than age 70, get at least 1,200 mg of calcium and at least 800 international units (IU) of vitamin D per day. Smoking and drinking excessive alcohol increase the risk of osteoporosis. Eat foods that are rich in calcium and vitamin D, and do weight-bearing exercises several times each week as directed by your health care provider. How does menopause affect my mental health? Depression may occur at any age, but it is more common as you become older. Common symptoms of depression include: Feeling depressed. Changes in sleep patterns. Changes in appetite or eating patterns. Feeling an overall lack of motivation or enjoyment of activities that you previously enjoyed. Frequent crying spells. Talk with your health care provider if you think that you are experiencing any of these symptoms. General instructions See your health care provider for regular wellness exams and vaccines. This may include: Scheduling regular health, dental, and eye exams. Getting and maintaining your vaccines. These include: Influenza vaccine. Get this vaccine each year before the flu season begins. Pneumonia vaccine. Shingles vaccine. Tetanus, diphtheria, and pertussis (Tdap) booster vaccine. Your health care provider may also recommend other immunizations. Tell your health care provider if you have ever been abused or do not feel safe at home. Summary Menopause is a normal process in which your ability to get pregnant comes to an end. This condition causes hot flashes, night sweats, decreased interest in sex, mood swings, headaches, or lack  of sleep. Treatment for this condition may include hormone replacement therapy. Take actions to keep yourself healthy, including exercising regularly, eating a healthy diet, watching your weight, and checking your blood pressure and blood sugar levels. Get screened for cancer and depression. Make sure that you are up to date with all your vaccines. This information is not intended to replace advice given to you by your health care provider. Make sure you discuss any questions you have with your health care provider. Document Revised: 10/06/2020 Document Reviewed: 10/06/2020 Elsevier Patient Education  2023 Elsevier Inc.  

## 2022-05-26 LAB — LIPID PANEL
Cholesterol: 230 mg/dL — ABNORMAL HIGH (ref <200)
HDL: 77 mg/dL (ref >=50)
LDL Cholesterol (Calc): 139 mg/dL (calc) — ABNORMAL HIGH
Non-HDL Cholesterol (Calc): 153 mg/dL (calc) — ABNORMAL HIGH (ref <130)
Total CHOL/HDL Ratio: 3 (calc) (ref <5.0)
Triglycerides: 52 mg/dL (ref <150)

## 2022-05-26 LAB — COMPLETE METABOLIC PANEL WITH GFR
AG Ratio: 1.4 (calc) (ref 1.0–2.5)
ALT: 25 U/L (ref 6–29)
AST: 28 U/L (ref 10–35)
Albumin: 4.2 g/dL (ref 3.6–5.1)
Alkaline phosphatase (APISO): 123 U/L (ref 37–153)
BUN: 9 mg/dL (ref 7–25)
CO2: 28 mmol/L (ref 20–32)
Calcium: 9.3 mg/dL (ref 8.6–10.4)
Chloride: 106 mmol/L (ref 98–110)
Creat: 0.93 mg/dL (ref 0.50–1.03)
Globulin: 3 g/dL (calc) (ref 1.9–3.7)
Glucose, Bld: 89 mg/dL (ref 65–99)
Potassium: 4.1 mmol/L (ref 3.5–5.3)
Sodium: 143 mmol/L (ref 135–146)
Total Bilirubin: 0.5 mg/dL (ref 0.2–1.2)
Total Protein: 7.2 g/dL (ref 6.1–8.1)
eGFR: 74 mL/min/{1.73_m2} (ref >=60)

## 2022-05-26 LAB — CBC
HCT: 40.6 % (ref 35.0–45.0)
Hemoglobin: 13.5 g/dL (ref 11.7–15.5)
MCH: 30.5 pg (ref 27.0–33.0)
MCHC: 33.3 g/dL (ref 32.0–36.0)
MCV: 91.6 fL (ref 80.0–100.0)
MPV: 9.1 fL (ref 7.5–12.5)
Platelets: 289 10*3/uL (ref 140–400)
RBC: 4.43 10*6/uL (ref 3.80–5.10)
RDW: 13 % (ref 11.0–15.0)
WBC: 4.6 10*3/uL (ref 3.8–10.8)

## 2022-05-26 LAB — HEMOGLOBIN A1C
Hgb A1c MFr Bld: 6.2 % of total Hgb — ABNORMAL HIGH (ref <5.7)
Mean Plasma Glucose: 131 mg/dL
eAG (mmol/L): 7.3 mmol/L

## 2022-05-26 LAB — HEPATITIS C ANTIBODY: Hepatitis C Ab: NONREACTIVE

## 2022-05-26 LAB — HIV ANTIBODY (ROUTINE TESTING W REFLEX): HIV 1&2 Ab, 4th Generation: NONREACTIVE

## 2022-05-27 LAB — CYTOLOGY - PAP
Diagnosis: NEGATIVE
Diagnosis: REACTIVE

## 2022-06-08 ENCOUNTER — Ambulatory Visit: Payer: Commercial Managed Care - PPO

## 2022-06-08 NOTE — Progress Notes (Signed)
Hypertension, follow-up  BP Readings from Last 3 Encounters:  05/25/22 (!) 160/103  04/09/21 (!) 135/106  02/24/21 94/68   Wt Readings from Last 3 Encounters:  05/25/22 170 lb (77.1 kg)  04/09/21 167 lb 9.6 oz (76 kg)  02/24/21 165 lb (74.8 kg)     She was last seen for hypertension 05/25/2022 BP at that visit was 160/103.  Management since that visit includes restarting linsinopril/HCTZ 20/25mg  daily.  She reports excellent compliance with treatment. She is not having side effects.    Contact: [phone]

## 2022-07-02 ENCOUNTER — Ambulatory Visit (AMBULATORY_SURGERY_CENTER): Payer: Self-pay

## 2022-07-02 VITALS — Ht 66.0 in | Wt 170.0 lb

## 2022-07-02 DIAGNOSIS — Z1211 Encounter for screening for malignant neoplasm of colon: Secondary | ICD-10-CM

## 2022-07-02 MED ORDER — NA SULFATE-K SULFATE-MG SULF 17.5-3.13-1.6 GM/177ML PO SOLN: 1.0000 | Freq: Once | ORAL | 0 refills | Status: AC

## 2022-07-02 NOTE — Progress Notes (Signed)

## 2022-07-13 ENCOUNTER — Encounter: Payer: Self-pay | Admitting: Internal Medicine

## 2022-07-13 DIAGNOSIS — I10 Essential (primary) hypertension: Secondary | ICD-10-CM

## 2022-07-13 MED ORDER — LISINOPRIL-HYDROCHLOROTHIAZIDE 20-25 MG PO TABS: 1.0000 | ORAL_TABLET | Freq: Every day | ORAL | 1 refills | Status: DC

## 2022-07-21 ENCOUNTER — Ambulatory Visit: Payer: Commercial Managed Care - PPO

## 2022-07-21 ENCOUNTER — Telehealth: Payer: Self-pay | Admitting: Gastroenterology

## 2022-07-21 DIAGNOSIS — Z1211 Encounter for screening for malignant neoplasm of colon: Secondary | ICD-10-CM

## 2022-07-21 MED ORDER — NA SULFATE-K SULFATE-MG SULF 17.5-3.13-1.6 GM/177ML PO SOLN: 1.0000 | Freq: Once | ORAL | 0 refills | Status: AC

## 2022-07-21 NOTE — Telephone Encounter (Signed)
Pt ate nuts and a salad on Sunday.  Advised her that she is okay to proceed with procedure tomorrow.  Pt also asked if prep required a RX.  States that her pharmacy told her they did not receive an RX.  Advised her I would resend suprep to [facility] at [facility].

## 2022-07-21 NOTE — Telephone Encounter (Signed)
Inbound call from patient stating she ate a salad with nuts this past Sunday.patient is due to have a procedure 2/22. Please advise on prep.  Thank you

## 2022-07-22 ENCOUNTER — Encounter: Payer: Self-pay | Admitting: Gastroenterology

## 2022-07-22 ENCOUNTER — Ambulatory Visit: Payer: BC Managed Care – PPO | Admitting: Gastroenterology

## 2022-07-22 VITALS — BP 121/79 | HR 72 | Temp 98.6°F | Resp 20 | Ht 66.0 in | Wt 170.0 lb

## 2022-07-22 DIAGNOSIS — Z1211 Encounter for screening for malignant neoplasm of colon: Secondary | ICD-10-CM | POA: Diagnosis not present

## 2022-07-22 MED ORDER — SODIUM CHLORIDE 0.9 % IV SOLN
500.0000 mL | Freq: Once | INTRAVENOUS | Status: DC
Start: 2022-07-22 — End: 2022-07-22

## 2022-07-22 NOTE — Progress Notes (Signed)
To pacu, VSS. Report to [email or web address] 

## 2022-07-22 NOTE — Progress Notes (Signed)
Pt's states no medical or surgical changes since previsit or office visit. 

## 2022-07-22 NOTE — Progress Notes (Signed)
[facility] History and Physical   Primary Care Physician:  Jearld Fenton, NP   Reason for Procedure:   Colon cancer screening  Plan:    Screening colonoscopy     HPI: Jaclyn Mcknight is a 52 y.o. female undergoing initial average risk screening colonoscopy.  She has no family history of colon cancer and no chronic GI symptoms.    Past Medical History:  Diagnosis Date   Anxiety    Back pain at L4-L5 level    Gravida 3 para 3    Hypertension    Migraines    MIGRAINES   Seizures ([place])    X1 IN 2016    Past Surgical History:  Procedure Laterality Date   BRAIN SURGERY  1999   benign tumor removal.   BREAST LUMPECTOMY Right 12/21/2017   Procedure: EXCISION RIGHT BREAST NIPPLE MASS;  Surgeon: Jules Husbands, MD;  Location: [facility] ORS;  Service: General;  Laterality: Right;   TUBAL LIGATION      Prior to Admission medications   Medication Sig Start Date End Date Taking? Authorizing Provider  lisinopril-hydrochlorothiazide (ZESTORETIC) 20-25 MG tablet Take 1 tablet by mouth daily. 07/13/22  Yes Baity, Coralie Keens, NP  cyclobenzaprine (FLEXERIL) 5 MG tablet Take 1 tablet (5 mg total) by mouth 3 (three) times daily as needed for muscle spasms. 05/01/20   Malfi, Lupita Raider, FNP  ibuprofen (ADVIL,MOTRIN) 200 MG tablet Take 800 mg by mouth every 8 (eight) hours as needed (for pain.).     [provider]  ipratropium (ATROVENT) 0.06 % nasal spray Place 2 sprays into both nostrils 4 (four) times daily. For up to 5-7 days then stop. Patient not taking: Reported on 07/02/2022 04/09/21   Olin Hauser, DO  SUMAtriptan (IMITREX) 50 MG tablet Take 0.5-1 tablets (25-50 mg total) by mouth once as needed for up to 1 dose for migraine. May repeat one dose in 2 hours if headache persists, for max dose 24 hours Patient not taking: Reported on 07/22/2022 05/25/22   Jearld Fenton, NP  venlafaxine XR (EFFEXOR XR) 75 MG 24 hr capsule Take 1 capsule (75 mg total) by mouth  daily with breakfast. Patient not taking: Reported on 07/02/2022 02/24/21   Jearld Fenton, NP    Current Outpatient Medications  Medication Sig Dispense Refill   lisinopril-hydrochlorothiazide (ZESTORETIC) 20-25 MG tablet Take 1 tablet by mouth daily. 90 tablet 1   cyclobenzaprine (FLEXERIL) 5 MG tablet Take 1 tablet (5 mg total) by mouth 3 (three) times daily as needed for muscle spasms. 30 tablet 1   ibuprofen (ADVIL,MOTRIN) 200 MG tablet Take 800 mg by mouth every 8 (eight) hours as needed (for pain.).      ipratropium (ATROVENT) 0.06 % nasal spray Place 2 sprays into both nostrils 4 (four) times daily. For up to 5-7 days then stop. (Patient not taking: Reported on 07/02/2022) 15 mL 0   SUMAtriptan (IMITREX) 50 MG tablet Take 0.5-1 tablets (25-50 mg total) by mouth once as needed for up to 1 dose for migraine. May repeat one dose in 2 hours if headache persists, for max dose 24 hours (Patient not taking: Reported on 07/22/2022) 12 tablet 5   venlafaxine XR (EFFEXOR XR) 75 MG 24 hr capsule Take 1 capsule (75 mg total) by mouth daily with breakfast. (Patient not taking: Reported on 07/02/2022) 90 capsule 1   Current Facility-Administered Medications  Medication Dose Route Frequency Provider Last Rate Last Admin   0.9 %  sodium  chloride infusion  500 mL Intravenous Once Daryel November, MD        Allergies as of 07/22/2022   (No Known Allergies)    Family History  Problem Relation Age of Onset   Hypertension Mother    Diabetes Mother    Hypertension Sister    Hypertension Maternal Aunt    Diabetes Maternal Aunt    Hypertension Maternal Grandmother    Diabetes Maternal Grandmother    Colon cancer Neg Hx    Colon polyps Neg Hx    Esophageal cancer Neg Hx    Rectal cancer Neg Hx    Stomach cancer Neg Hx     Social History   Socioeconomic History   Marital status: Married    Spouse name: Not on file   Number of children: Not on file   Years of education: Not on file   Highest  education level: Not on file  Occupational History   Not on file  Tobacco Use   Smoking status: Never   Smokeless tobacco: Never  Vaping Use   Vaping Use: Never used  Substance and Sexual Activity   Alcohol use: Yes    Alcohol/week: 1.0 standard drink of alcohol    Types: 1 Glasses of wine per week    Comment: 2 glasses wine per month.   Drug use: Not Currently    Types: Marijuana    Comment: OCC   Sexual activity: Yes    Comment: tubal ligation  Other Topics Concern   Not on file  Social History Narrative   Not on file   Social Determinants of Health   Financial Resource Strain: Not on file  Food Insecurity: Not on file  Transportation Needs: Not on file  Physical Activity: Not on file  Stress: Not on file  Social Connections: Not on file  Intimate Partner Violence: Not on file    Review of Systems:  All other review of systems negative except as mentioned in the HPI.  Physical Exam: Vital signs BP 112/77   Pulse 77   Temp 98.6 F (37 C) (Skin)   Ht 5' 6"$  (1.676 m)   Wt 170 lb (77.1 kg)   LMP 11/15/2017 (Approximate)   SpO2 99%   BMI 27.44 kg/m   General:   Alert,  Well-developed, well-nourished, pleasant and cooperative in NAD Airway:  Mallampati 3 Lungs:  Clear throughout to auscultation.   Heart:  Regular rate and rhythm; no murmurs, clicks, rubs,  or gallops. Abdomen:  Soft, nontender and nondistended. Normal bowel sounds.   Neuro/Psych:  Normal mood and affect. A and O x 3   Andi Layfield E. Candis Schatz, MD [facility]

## 2022-07-22 NOTE — Op Note (Signed)
[name] Procedure Date: 07/22/2022 10:41 AM MRN: [id number] Endoscopist: Nicki Reaper E. Candis Schatz , MD, [id number] Age: 52 Referring MD:  Date of Birth: 07/29/1970 Gender: Female Account #: [id number] Procedure:                Colonoscopy Indications:              Screening for colorectal malignant neoplasm, This                            is the patient's first colonoscopy Medicines:                Monitored Anesthesia Care Procedure:                Pre-Anesthesia Assessment:                           - Prior to the procedure, a History and Physical                            was performed, and patient medications and                            allergies were reviewed. The patient's tolerance of                            previous anesthesia was also reviewed. The risks                            and benefits of the procedure and the sedation                            options and risks were discussed with the patient.                            All questions were answered, and informed consent                            was obtained. Prior Anticoagulants: The patient has                            taken no anticoagulant or antiplatelet agents. ASA                            Grade Assessment: II - A patient with mild systemic                            disease. After reviewing the risks and benefits,                            the patient was deemed in satisfactory condition to                            undergo the procedure.  After obtaining informed consent, the colonoscope                            was passed under direct vision. Throughout the                            procedure, the patient's blood pressure, pulse, and                            oxygen saturations were monitored continuously. The                            Olympus SN [id number] was introduced through the anus                            and  advanced to the the terminal ileum, with                            identification of the appendiceal orifice and IC                            valve. The colonoscopy was performed without                            difficulty. The patient tolerated the procedure                            well. The quality of the bowel preparation was                            excellent. The terminal ileum, ileocecal valve,                            appendiceal orifice, and rectum were photographed. Scope In: 10:49:00 AM Scope Out: 11:00:17 AM Scope Withdrawal Time: 0 hours 7 minutes 0 seconds  Total Procedure Duration: 0 hours 11 minutes 17 seconds  Findings:                 Hemorrhoids were found on perianal exam.                           The colon (entire examined portion) appeared normal.                           The terminal ileum appeared normal.                           Non-bleeding internal hemorrhoids were found during                            retroflexion. The hemorrhoids were Grade II                            (internal hemorrhoids that prolapse but reduce  spontaneously).                           Anal papilla(e) were hypertrophied.                           No additional abnormalities were found on                            retroflexion. Complications:            No immediate complications. Estimated Blood Loss:     Estimated blood loss: none. Impression:               - Hemorrhoids found on perianal exam.                           - The entire examined colon is normal.                           - The examined portion of the ileum was normal.                           - Non-bleeding internal hemorrhoids.                           - Anal papilla(e) were hypertrophied.                           - No specimens collected. Recommendation:           - Patient has a contact number available for                            emergencies. The signs and symptoms of  potential                            delayed complications were discussed with the                            patient. Return to normal activities tomorrow.                            Written discharge instructions were provided to the                            patient.                           - Resume previous diet.                           - Continue present medications.                           - Repeat colonoscopy in 10 years for screening  purposes.                           - Recommend daily fiber supplement or high fiber                            diet to reduce hemorrhoidal symptoms Jaclyn Mcknight E. Candis Schatz, MD 07/22/2022 11:10:39 AM This report has been signed electronically.

## 2022-07-22 NOTE — Patient Instructions (Signed)
High fiber diet is recommended.  Handouts on high fiber diet and hemorrhoids provided.  Repeat colonoscopy in 10 years.  YOU HAD AN ENDOSCOPIC PROCEDURE TODAY AT [facility]:   Refer to the procedure report that was given to you for any specific questions about what was found during the examination.  If the procedure report does not answer your questions, please call your gastroenterologist to clarify.  If you requested that your care partner not be given the details of your procedure findings, then the procedure report has been included in a sealed envelope for you to review at your convenience later.  YOU SHOULD EXPECT: Some feelings of bloating in the abdomen. Passage of more gas than usual.  Walking can help get rid of the air that was put into your GI tract during the procedure and reduce the bloating. If you had a lower endoscopy (such as a colonoscopy or flexible sigmoidoscopy) you may notice spotting of blood in your stool or on the toilet paper. If you underwent a bowel prep for your procedure, you may not have a normal bowel movement for a few days.  Please Note:  You might notice some irritation and congestion in your nose or some drainage.  This is from the oxygen used during your procedure.  There is no need for concern and it should clear up in a day or so.  SYMPTOMS TO REPORT IMMEDIATELY:  Following lower endoscopy (colonoscopy or flexible sigmoidoscopy):  Excessive amounts of blood in the stool  Significant tenderness or worsening of abdominal pains  Swelling of the abdomen that is new, acute  Fever of 100F or higher   For urgent or emergent issues, a gastroenterologist can be reached at any hour by calling [phone]. Do not use MyChart messaging for urgent concerns.    DIET:  We do recommend a small meal at first, but then you may proceed to your regular diet.  Drink plenty of fluids but you should avoid alcoholic beverages for 24 hours.  ACTIVITY:   You should plan to take it easy for the rest of today and you should NOT DRIVE or use heavy machinery until tomorrow (because of the sedation medicines used during the test).    FOLLOW UP: Our staff will call the number listed on your records the next business day following your procedure.  We will call around 7:15- 8:00 am to check on you and address any questions or concerns that you may have regarding the information given to you following your procedure. If we do not reach you, we will leave a message.     If any biopsies were taken you will be contacted by phone or by letter within the next 1-3 weeks.  Please call us at [phone] if you have not heard about the biopsies in 3 weeks.    SIGNATURES/CONFIDENTIALITY: You and/or your care partner have signed paperwork which will be entered into your electronic medical record.  These signatures attest to the fact that that the information above on your After Visit Summary has been reviewed and is understood.  Full responsibility of the confidentiality of this discharge information lies with you and/or your care-partner.

## 2022-07-23 ENCOUNTER — Telehealth: Payer: Self-pay

## 2022-07-23 NOTE — Telephone Encounter (Signed)
  Follow up Call-     07/22/2022   10:13 AM  Call back number  Post procedure Call Back phone  # [phone]  Permission to leave phone message Yes     Patient questions:  Do you have a fever, pain , or abdominal swelling? No. Pain Score  0 *  Have you tolerated food without any problems? Yes.    Have you been able to return to your normal activities? Yes.    Do you have any questions about your discharge instructions: Diet   No. Medications  No. Follow up visit  No.  Do you have questions or concerns about your Care? No.  Actions: * If pain score is 4 or above: No action needed, pain <4.

## 2022-08-27 ENCOUNTER — Ambulatory Visit: Payer: Commercial Managed Care - PPO

## 2022-09-02 ENCOUNTER — Encounter: Payer: Self-pay | Admitting: Internal Medicine

## 2023-06-15 ENCOUNTER — Telehealth (INDEPENDENT_AMBULATORY_CARE_PROVIDER_SITE_OTHER): Payer: Self-pay | Admitting: Internal Medicine

## 2023-06-15 ENCOUNTER — Encounter: Payer: Self-pay | Admitting: Internal Medicine

## 2023-06-15 DIAGNOSIS — J00 Acute nasopharyngitis [common cold]: Secondary | ICD-10-CM

## 2023-06-15 DIAGNOSIS — J069 Acute upper respiratory infection, unspecified: Secondary | ICD-10-CM

## 2023-06-15 MED ORDER — PROMETHAZINE-DM 6.25-15 MG/5ML PO SYRP: 5.0000 mL | ORAL_SOLUTION | Freq: Four times a day (QID) | ORAL | 0 refills | Status: DC | PRN

## 2023-06-15 MED ORDER — PREDNISONE 10 MG PO TABS: ORAL_TABLET | ORAL | 0 refills | Status: DC

## 2023-06-15 NOTE — Patient Instructions (Signed)

## 2023-06-15 NOTE — Progress Notes (Signed)
 Virtual Visit via Video Note  I connected with Jaclyn Mcknight on 06/15/23 at 11:20 AM EST by a video enabled telemedicine application and verified that I am speaking with the correct person using two identifiers.  Location: Patient: Home Provider: Office  Persons participating in this video call: Helayne Lo, NP and Rae Bugler   I discussed the limitations of evaluation and management by telemedicine and the availability of in person appointments. The patient expressed understanding and agreed to proceed.  History of Present Illness:  Discussed the use of AI scribe software for clinical note transcription with the patient, who gave verbal consent to proceed.  The patient, who works in a nursing home, presented with a deep cough that started on Friday evening and worsened by Saturday. The cough has been productive, and associated with a headache, nasal congestion, and chills. The patient also reported a mild sore throat, which she attributed to the coughing. She experienced shortness of breath, described as feeling 'a little winded.' The patient also reported diarrhea, which has since resolved, and body aches, particularly in the legs, which have also resolved.  The patient has been around others who are sick, but has not been tested for COVID or flu. She has been managing her symptoms with turmeric, peach tea, Nyquil, DayQuil, and cough drops. Despite these symptoms, the patient reported feeling like her condition is improving.      Past Medical History:  Diagnosis Date   Anxiety    Back pain at L4-L5 level    Gravida 3 para 3    Hypertension    Migraines    MIGRAINES   Seizures (HCC)    X1 IN 2016    Current Outpatient Medications  Medication Sig Dispense Refill   cyclobenzaprine  (FLEXERIL ) 5 MG tablet Take 1 tablet (5 mg total) by mouth 3 (three) times daily as needed for muscle spasms. 30 tablet 1   ibuprofen  (ADVIL ,MOTRIN ) 200 MG tablet Take 800 mg by mouth every  8 (eight) hours as needed (for pain.).      ipratropium (ATROVENT ) 0.06 % nasal spray Place 2 sprays into both nostrils 4 (four) times daily. For up to 5-7 days then stop. (Patient not taking: Reported on 07/02/2022) 15 mL 0   lisinopril -hydrochlorothiazide  (ZESTORETIC ) 20-25 MG tablet Take 1 tablet by mouth daily. 90 tablet 1   SUMAtriptan  (IMITREX ) 50 MG tablet Take 0.5-1 tablets (25-50 mg total) by mouth once as needed for up to 1 dose for migraine. May repeat one dose in 2 hours if headache persists, for max dose 24 hours (Patient not taking: Reported on 07/22/2022) 12 tablet 5   venlafaxine  XR (EFFEXOR  XR) 75 MG 24 hr capsule Take 1 capsule (75 mg total) by mouth daily with breakfast. (Patient not taking: Reported on 07/02/2022) 90 capsule 1   No current facility-administered medications for this visit.    No Known Allergies  Family History  Problem Relation Age of Onset   Hypertension Mother    Diabetes Mother    Hypertension Sister    Hypertension Maternal Aunt    Diabetes Maternal Aunt    Hypertension Maternal Grandmother    Diabetes Maternal Grandmother    Colon cancer Neg Hx    Colon polyps Neg Hx    Esophageal cancer Neg Hx    Rectal cancer Neg Hx    Stomach cancer Neg Hx     Social History   Socioeconomic History   Marital status: Married    Spouse name: Not on file  Number of children: Not on file   Years of education: Not on file   Highest education level: Not on file  Occupational History   Not on file  Tobacco Use   Smoking status: Never   Smokeless tobacco: Never  Vaping Use   Vaping status: Never Used  Substance and Sexual Activity   Alcohol use: Yes    Alcohol/week: 1.0 standard drink of alcohol    Types: 1 Glasses of wine per week    Comment: 2 glasses wine per month.   Drug use: Not Currently    Types: Marijuana    Comment: OCC   Sexual activity: Yes    Comment: tubal ligation  Other Topics Concern   Not on file  Social History Narrative   Not  on file   Social Drivers of Health   Financial Resource Strain: Not on file  Food Insecurity: Not on file  Transportation Needs: Not on file  Physical Activity: Not on file  Stress: Not on file  Social Connections: Not on file  Intimate Partner Violence: Not on file     Constitutional: Pt reports headache. Denies fever, malaise, fatigue, or abrupt weight changes.  HEENT: Pt reports runny nose, nasal congestion and sore throat. Denies eye pain, eye redness, ear pain, ringing in the ears, wax buildup, bloody nose. Respiratory: Patient reports cough and shortness of breath.  Denies difficulty breathing.   Cardiovascular: Denies chest pain, chest tightness, palpitations or swelling in the hands or feet.  Gastrointestinal: Pt reports diarrhea. Denies abdominal pain, bloating, constipation, or blood in the stool.  GU: Denies urgency, frequency, pain with urination, burning sensation, blood in urine, odor or discharge. Musculoskeletal: Pt reports body aches. Denies decrease in range of motion, difficulty with gait, or joint swelling.   No other specific complaints in a complete review of systems (except as listed in HPI above).    Observations/Objective:  Wt Readings from Last 3 Encounters:  07/22/22 170 lb (77.1 kg)  07/02/22 170 lb (77.1 kg)  05/25/22 170 lb (77.1 kg)    General: Appears her stated age, well developed, well nourished in NAD. Skin: No rashes noted. HEENT: Head: normal shape and size, no sinus tenderness reported; Nose: congestion noted; Throat/Mouth: hoarseness noted.  Pulmonary/Chest: Normal effort, dry cough noted. No respiratory distress.  Neurological: Alert and oriented.   BMET    Component Value Date/Time   NA 143 05/25/2022 0932   K 4.1 05/25/2022 0932   CL 106 05/25/2022 0932   CO2 28 05/25/2022 0932   GLUCOSE 89 05/25/2022 0932   BUN 9 05/25/2022 0932   CREATININE 0.93 05/25/2022 0932   CALCIUM 9.3 05/25/2022 0932   GFRNONAA >60 05/30/2015 1806    GFRAA >60 05/30/2015 1806    Lipid Panel     Component Value Date/Time   CHOL 230 (H) 05/25/2022 0932   TRIG 52 05/25/2022 0932   HDL 77 05/25/2022 0932   CHOLHDL 3.0 05/25/2022 0932   LDLCALC 139 (H) 05/25/2022 0932    CBC    Component Value Date/Time   WBC 4.6 05/25/2022 0932   RBC 4.43 05/25/2022 0932   HGB 13.5 05/25/2022 0932   HCT 40.6 05/25/2022 0932   PLT 289 05/25/2022 0932   MCV 91.6 05/25/2022 0932   MCH 30.5 05/25/2022 0932   MCHC 33.3 05/25/2022 0932   RDW 13.0 05/25/2022 0932   LYMPHSABS 2.1 05/30/2015 1806   MONOABS 0.6 05/30/2015 1806   EOSABS 0.3 05/30/2015 1806   BASOSABS 0.0  05/30/2015 1806    Hgb A1C Lab Results  Component Value Date   HGBA1C 6.2 (H) 05/25/2022       Assessment and Plan: Assessment and Plan    Upper Respiratory Infection Symptoms of cough, congestion, runny nose, and mild sore throat since Friday. Some improvement noted. Possible viral etiology given the clinical course and exposure to sick contacts. -Start Prednisone  taper for 6 days. -Advise over-the-counter Zyrtec  and Flonase  for postnasal drip and nasal congestion. -Prescribe Promethazine  DM cough syrup to control cough. -If no improvement by Monday, consider adding antibiotics.    Scheduled appointment for follow-up of chronic conditions  Follow Up Instructions:    I discussed the assessment and treatment plan with the patient. The patient was provided an opportunity to ask questions and all were answered. The patient agreed with the plan and demonstrated an understanding of the instructions.   The patient was advised to call back or seek an in-person evaluation if the symptoms worsen or if the condition fails to improve as anticipated.    Helayne Lo, NP

## 2023-08-23 ENCOUNTER — Ambulatory Visit (INDEPENDENT_AMBULATORY_CARE_PROVIDER_SITE_OTHER): Admitting: Internal Medicine

## 2023-08-23 ENCOUNTER — Encounter: Payer: Self-pay | Admitting: Internal Medicine

## 2023-08-23 VITALS — BP 128/78 | Ht 66.0 in | Wt 178.8 lb

## 2023-08-23 DIAGNOSIS — Z6828 Body mass index (BMI) 28.0-28.9, adult: Secondary | ICD-10-CM

## 2023-08-23 DIAGNOSIS — E782 Mixed hyperlipidemia: Secondary | ICD-10-CM

## 2023-08-23 DIAGNOSIS — E663 Overweight: Secondary | ICD-10-CM

## 2023-08-23 DIAGNOSIS — I10 Essential (primary) hypertension: Secondary | ICD-10-CM | POA: Diagnosis not present

## 2023-08-23 DIAGNOSIS — F419 Anxiety disorder, unspecified: Secondary | ICD-10-CM

## 2023-08-23 DIAGNOSIS — G43009 Migraine without aura, not intractable, without status migrainosus: Secondary | ICD-10-CM

## 2023-08-23 DIAGNOSIS — R7303 Prediabetes: Secondary | ICD-10-CM | POA: Insufficient documentation

## 2023-08-23 MED ORDER — SUMATRIPTAN SUCCINATE 50 MG PO TABS: 25.0000 mg | ORAL_TABLET | Freq: Once | ORAL | 5 refills | Status: AC | PRN

## 2023-08-23 MED ORDER — VENLAFAXINE HCL ER 75 MG PO CP24: 75.0000 mg | ORAL_CAPSULE | Freq: Every day | ORAL | 1 refills | Status: AC

## 2023-08-23 MED ORDER — LISINOPRIL-HYDROCHLOROTHIAZIDE 20-25 MG PO TABS: 1.0000 | ORAL_TABLET | Freq: Every day | ORAL | 1 refills | Status: AC

## 2023-08-23 NOTE — Assessment & Plan Note (Signed)
 Try to avoid triggers Continue imitrex as needed Will monitor

## 2023-08-23 NOTE — Progress Notes (Signed)
 Subjective:    Patient ID: Jaclyn Mcknight, female    DOB: [birth year], 53 y.o.   MRN: [id number]  HPI  Pt presents to the clinic today for follow up of chronic conditions.   HTN: Her BP today is 94/68. She is taking lisinopril-hct as prescribed, but she reports she ran out about 1 week . ECG from 11/2017 reviewed.  Migraines: These occur 1-2 x month. Triggered by certain lights. She takes imitrex as needed with good relief of symptoms. She does not follow with neurology.  Anxiety: Chronic, she is not currently taking any medications for this but has been on venlafaxine in the past. She has been having trouble with sleep, she would like to get restarted back on this medication. She is not currently seeing a therapist. She denies depression, SI/HI.  HLD: Her last LDL was 139, triglycerides 52, 04/2022. She is not taking any cholesterol lowering medication at this time. She tries to consume a low fat diet.  Prediabetes: Her last A1C was 6.2%, 04/2022. She is not taking any oral diabetic medication at this time. She does not check her sugars.   Review of Systems     Past Medical History:  Diagnosis Date   Anxiety    Back pain at L4-L5 level    Gravida 3 para 3    Hypertension    Migraines    MIGRAINES   Seizures (HCC)    X1 IN 2016    Current Outpatient Medications  Medication Sig Dispense Refill   cyclobenzaprine (FLEXERIL) 5 MG tablet Take 1 tablet (5 mg total) by mouth 3 (three) times daily as needed for muscle spasms. 30 tablet 1   ibuprofen (ADVIL,MOTRIN) 200 MG tablet Take 800 mg by mouth every 8 (eight) hours as needed (for pain.).      ipratropium (ATROVENT) 0.06 % nasal spray Place 2 sprays into both nostrils 4 (four) times daily. For up to 5-7 days then stop. (Patient not taking: Reported on 07/02/2022) 15 mL 0   lisinopril-hydrochlorothiazide (ZESTORETIC) 20-25 MG tablet Take 1 tablet by mouth daily. 90 tablet 1   predniSONE (DELTASONE) 10 MG tablet Take 6 tabs on  day 1, 5 tabs on day 2, 4 tabs on day 3, 3 tabs on day 4, 2 tabs on day 5, 1 tab on day 6 21 tablet 0   promethazine-dextromethorphan (PROMETHAZINE-DM) 6.25-15 MG/5ML syrup Take 5 mLs by mouth 4 (four) times daily as needed. 118 mL 0   SUMAtriptan (IMITREX) 50 MG tablet Take 0.5-1 tablets (25-50 mg total) by mouth once as needed for up to 1 dose for migraine. May repeat one dose in 2 hours if headache persists, for max dose 24 hours (Patient not taking: Reported on 07/22/2022) 12 tablet 5   venlafaxine XR (EFFEXOR XR) 75 MG 24 hr capsule Take 1 capsule (75 mg total) by mouth daily with breakfast. (Patient not taking: Reported on 07/02/2022) 90 capsule 1   No current facility-administered medications for this visit.    No Known Allergies  Family History  Problem Relation Age of Onset   Hypertension Mother    Diabetes Mother    Hypertension Sister    Hypertension Maternal Aunt    Diabetes Maternal Aunt    Hypertension Maternal Grandmother    Diabetes Maternal Grandmother    Colon cancer Neg Hx    Colon polyps Neg Hx    Esophageal cancer Neg Hx    Rectal cancer Neg Hx    Stomach cancer Neg Hx  Social History   Socioeconomic History   Marital status: Married    Spouse name: Not on file   Number of children: Not on file   Years of education: Not on file   Highest education level: Not on file  Occupational History   Not on file  Tobacco Use   Smoking status: Never   Smokeless tobacco: Never  Vaping Use   Vaping status: Never Used  Substance and Sexual Activity   Alcohol use: Yes    Alcohol/week: 1.0 standard drink of alcohol    Types: 1 Glasses of wine per week    Comment: 2 glasses wine per month.   Drug use: Not Currently    Types: Marijuana    Comment: OCC   Sexual activity: Yes    Comment: tubal ligation  Other Topics Concern   Not on file  Social History Narrative   Not on file   Social Drivers of Health   Financial Resource Strain: Not on file  Food  Insecurity: Not on file  Transportation Needs: Not on file  Physical Activity: Not on file  Stress: Not on file  Social Connections: Not on file  Intimate Partner Violence: Not on file     Constitutional: Pt reports intermittent headaches. Denies fever, malaise, fatigue, or abrupt weight changes.  HEENT: Denies eye pain, eye redness, ear pain, ringing in the ears, wax buildup, runny nose, nasal congestion, bloody nose, or sore throat. Respiratory: Denies difficulty breathing, shortness of breath, cough or sputum production.   Cardiovascular: Denies chest pain, chest tightness, palpitations or swelling in the hands or feet.  Gastrointestinal: Denies abdominal pain, bloating, constipation, diarrhea or blood in the stool.  GU: Denies urgency, frequency, pain with urination, burning sensation, blood in urine, odor or discharge. Musculoskeletal: Denies decrease in range of motion, difficulty with gait, muscle pain or joint pain and swelling.  Skin: Denies redness, rashes, lesions or ulcercations.  Neurological: Denies dizziness, difficulty with memory, difficulty with speech or problems with balance and coordination.  Psych: Pt has a history of anxiety. Denies depression, SI/HI.  No other specific complaints in a complete review of systems (except as listed in HPI above).  Objective:   Physical Exam BP 128/78 (BP Location: Left Arm, Patient Position: Sitting, Cuff Size: Normal)   Ht 5\' 6"  (1.676 m)   Wt 178 lb 12.8 oz (81.1 kg)   LMP 11/15/2017 (Approximate)   BMI 28.86 kg/m    Wt Readings from Last 3 Encounters:  07/22/22 170 lb (77.1 kg)  07/02/22 170 lb (77.1 kg)  05/25/22 170 lb (77.1 kg)    General: Appears her stated age, overweight in NAD. Skin: Warm, dry and intact. HEENT: Head: normal shape and size; Eyes: EOMs intact;  Cardiovascular: Normal rate and rhythm. S1,S2 noted.  No murmur, rubs or gallops noted. No JVD or BLE edema.  Pulmonary/Chest: Normal effort and positive  vesicular breath sounds. No respiratory distress. No wheezes, rales or ronchi noted.  Neurological: Alert and oriented.  Psychiatric: Mood and affect normal. Behavior is normal. Judgment and thought content normal.    BMET    Component Value Date/Time   NA 143 05/25/2022 0932   K 4.1 05/25/2022 0932   CL 106 05/25/2022 0932   CO2 28 05/25/2022 0932   GLUCOSE 89 05/25/2022 0932   BUN 9 05/25/2022 0932   CREATININE 0.93 05/25/2022 0932   CALCIUM 9.3 05/25/2022 0932   GFRNONAA >60 05/30/2015 1806   GFRAA >60 05/30/2015 1806  Lipid Panel     Component Value Date/Time   CHOL 230 (H) 05/25/2022 0932   TRIG 52 05/25/2022 0932   HDL 77 05/25/2022 0932   CHOLHDL 3.0 05/25/2022 0932   LDLCALC 139 (H) 05/25/2022 0932    CBC    Component Value Date/Time   WBC 4.6 05/25/2022 0932   RBC 4.43 05/25/2022 0932   HGB 13.5 05/25/2022 0932   HCT 40.6 05/25/2022 0932   PLT 289 05/25/2022 0932   MCV 91.6 05/25/2022 0932   MCH 30.5 05/25/2022 0932   MCHC 33.3 05/25/2022 0932   RDW 13.0 05/25/2022 0932   LYMPHSABS 2.1 05/30/2015 1806   MONOABS 0.6 05/30/2015 1806   EOSABS 0.3 05/30/2015 1806   BASOSABS 0.0 05/30/2015 1806    Hgb A1C Lab Results  Component Value Date   HGBA1C 6.2 (H) 05/25/2022       Assessment & Plan:   RTC in 6 months for your annual exam Nicki Reaper, NP

## 2023-08-23 NOTE — Assessment & Plan Note (Signed)
A1c today Encourage low-carb diet 

## 2023-08-23 NOTE — Assessment & Plan Note (Signed)
 Deteriorated, will restart venlafaxine Support offered

## 2023-08-23 NOTE — Assessment & Plan Note (Signed)
C-Met and lipid profile today Encouraged low-fat diet

## 2023-08-23 NOTE — Patient Instructions (Signed)

## 2023-08-23 NOTE — Assessment & Plan Note (Signed)
 Encourage diet and exercise for weight loss

## 2023-08-23 NOTE — Assessment & Plan Note (Signed)
Controlled on lisinopril HCT, refilled today Reinforced DASH diet and exercise for weight loss C-Met today 

## 2023-08-24 ENCOUNTER — Encounter: Payer: Self-pay | Admitting: Internal Medicine

## 2023-08-24 LAB — LIPID PANEL
Cholesterol: 210 mg/dL — ABNORMAL HIGH (ref <200)
HDL: 60 mg/dL (ref >=50)
LDL Cholesterol (Calc): 133 mg/dL — ABNORMAL HIGH
Non-HDL Cholesterol (Calc): 150 mg/dL — ABNORMAL HIGH (ref <130)
Total CHOL/HDL Ratio: 3.5 (calc) (ref <5.0)
Triglycerides: 73 mg/dL (ref <150)

## 2023-08-24 LAB — CBC
HCT: 40.1 % (ref 35.0–45.0)
Hemoglobin: 13.3 g/dL (ref 11.7–15.5)
MCH: 30.2 pg (ref 27.0–33.0)
MCHC: 33.2 g/dL (ref 32.0–36.0)
MCV: 91.1 fL (ref 80.0–100.0)
MPV: 9.2 fL (ref 7.5–12.5)
Platelets: 287 10*3/uL (ref 140–400)
RBC: 4.4 10*6/uL (ref 3.80–5.10)
RDW: 12.9 % (ref 11.0–15.0)
WBC: 5.8 10*3/uL (ref 3.8–10.8)

## 2023-08-24 LAB — COMPLETE METABOLIC PANEL WITH GFR
AG Ratio: 1.4 (calc) (ref 1.0–2.5)
ALT: 8 U/L (ref 6–29)
AST: 13 U/L (ref 10–35)
Albumin: 4 g/dL (ref 3.6–5.1)
Alkaline phosphatase (APISO): 121 U/L (ref 37–153)
BUN: 10 mg/dL (ref 7–25)
CO2: 29 mmol/L (ref 20–32)
Calcium: 9.2 mg/dL (ref 8.6–10.4)
Chloride: 105 mmol/L (ref 98–110)
Creat: 0.84 mg/dL (ref 0.50–1.03)
Globulin: 2.8 g/dL (ref 1.9–3.7)
Glucose, Bld: 82 mg/dL (ref 65–139)
Potassium: 4 mmol/L (ref 3.5–5.3)
Sodium: 143 mmol/L (ref 135–146)
Total Bilirubin: 0.4 mg/dL (ref 0.2–1.2)
Total Protein: 6.8 g/dL (ref 6.1–8.1)

## 2023-08-24 LAB — HEMOGLOBIN A1C
Hgb A1c MFr Bld: 6.3 %{Hb} — ABNORMAL HIGH (ref <5.7)
Mean Plasma Glucose: 134 mg/dL
eAG (mmol/L): 7.4 mmol/L

## 2023-09-13 ENCOUNTER — Telehealth (INDEPENDENT_AMBULATORY_CARE_PROVIDER_SITE_OTHER): Admitting: Family Medicine

## 2023-09-13 DIAGNOSIS — R109 Unspecified abdominal pain: Secondary | ICD-10-CM

## 2023-09-13 DIAGNOSIS — A084 Viral intestinal infection, unspecified: Secondary | ICD-10-CM | POA: Diagnosis not present

## 2023-09-13 DIAGNOSIS — R110 Nausea: Secondary | ICD-10-CM | POA: Diagnosis not present

## 2023-09-13 MED ORDER — ONDANSETRON 4 MG PO TBDP: 4.0000 mg | ORAL_TABLET | Freq: Three times a day (TID) | ORAL | 0 refills | Status: AC | PRN

## 2023-09-13 MED ORDER — DICYCLOMINE HCL 10 MG PO CAPS: 10.0000 mg | ORAL_CAPSULE | Freq: Three times a day (TID) | ORAL | 0 refills | Status: AC

## 2023-09-13 NOTE — Patient Instructions (Addendum)
 Thank you for coming to the office today.  Start Zofran for nausea Start Dicyclomine for bowel urgency and cramping  Consider IBGard OTC Peppermint Oil (Triple Coated Capsule) 180mg  take one 3 times daily to reduce diarrhea Consider Imodium AD for diarrhea if severe, short term only.   Viral Gastroenteritis, Adult  Viral gastroenteritis is also known as the stomach flu. This condition may affect your stomach, small intestine, and large intestine. It can cause sudden watery diarrhea, fever, and vomiting. This condition is caused by many different viruses. These viruses can be passed from person to person very easily (are contagious). Diarrhea and vomiting can make you feel weak and cause you to become dehydrated. You may not be able to keep fluids down. Dehydration can make you tired and thirsty, cause you to have a dry mouth, and decrease how often you urinate. It is important to replace the fluids that you lose from diarrhea and vomiting. What are the causes? Gastroenteritis is caused by many viruses, including rotavirus and norovirus. Norovirus is the most common cause in adults. You can get sick after being exposed to the viruses from other people. You can also get sick by: Eating food, drinking water, or touching a surface contaminated with one of these viruses. Sharing utensils or other personal items with an infected person. What increases the risk? You are more likely to develop this condition if you: Have a weak body defense system (immune system). Live with one or more children who are younger than 2 years. Live in a nursing home. Travel on cruise ships. What are the signs or symptoms? Symptoms of this condition start suddenly 1-3 days after exposure to a virus. Symptoms may last for a few days or for as long as a week. Common symptoms include watery diarrhea and vomiting. Other symptoms include: Fever. Headache. Fatigue. Pain in the abdomen. Chills. Weakness. Nausea. Muscle  aches. Loss of appetite. How is this diagnosed? This condition is diagnosed with a medical history and physical exam. You may also have a stool test to check for viruses or other infections. How is this treated? This condition typically goes away on its own. The focus of treatment is to prevent dehydration and restore lost fluids (rehydration). This condition may be treated with: An oral rehydration solution (ORS) to replace important salts and minerals (electrolytes) in your body. Take this if told by your health care provider. This is a drink that is sold at pharmacies and retail stores. Medicines to help with your symptoms. Probiotic supplements to reduce symptoms of diarrhea. Fluids given through an IV, if dehydration is severe. Older adults and people with other diseases or a weak immune system are at higher risk for dehydration. Follow these instructions at home: Eating and drinking  Take an ORS as told by your health care provider. Drink clear fluids in small amounts as you are able. Clear fluids include: Water. Ice chips. Diluted fruit juice. Low-calorie sports drinks. Drink enough fluid to keep your urine pale yellow. Eat small amounts of healthy foods every 3-4 hours as you are able. This may include whole grains, fruits, vegetables, lean meats, and yogurt. Avoid fluids that contain a lot of sugar or caffeine, such as energy drinks, sports drinks, and soda. Avoid spicy or fatty foods. Avoid alcohol. General instructions  Wash your hands often, especially after having diarrhea or vomiting. If soap and water are not available, use hand sanitizer. Make sure that all people in your household wash their hands well and often.  Take over-the-counter and prescription medicines only as told by your health care provider. Rest at home while you recover. Watch your condition for any changes. Take a warm bath to relieve any burning or pain from frequent diarrhea episodes. Keep all  follow-up visits. This is important. Contact a health care provider if you: Cannot keep fluids down. Have symptoms that get worse. Have new symptoms. Feel light-headed or dizzy. Have muscle cramps. Get help right away if you: Have chest pain. Have trouble breathing or you are breathing very quickly. Have a fast heartbeat. Feel extremely weak or you faint. Have a severe headache, a stiff neck, or both. Have a rash. Have severe pain, cramping, or bloating in your abdomen. Have skin that feels cold and clammy. Feel confused. Have pain when you urinate. Have signs of dehydration, such as: Dark urine, very little urine, or no urine. Cracked lips. Dry mouth. Sunken eyes. Sleepiness. Weakness. Have signs of bleeding, such as: Seeing blood in your vomit. Having vomit that looks like coffee grounds. Having bloody or black stools or stools that look like tar. These symptoms may be an emergency. Get help right away. Call 911. Do not wait to see if the symptoms will go away. Do not drive yourself to the hospital. Summary Viral gastroenteritis is also known as the stomach flu. It can cause sudden watery diarrhea, fever, and vomiting. This condition can be passed from person to person very easily (is contagious). Take an oral rehydration solution (ORS) if told by your health care provider. This is a drink that is sold at pharmacies and retail stores. Wash your hands often, especially after having diarrhea or vomiting. If soap and water are not available, use hand sanitizer. This information is not intended to replace advice given to you by your health care provider. Make sure you discuss any questions you have with your health care provider. Document Revised: 03/16/2021 Document Reviewed: 03/16/2021 Elsevier Patient Education  2024 Elsevier Inc.  Please schedule a Follow-up Appointment to: Return if symptoms worsen or fail to improve.  If you have any other questions or concerns, please  feel free to call the office or send a message through MyChart. You may also schedule an earlier appointment if necessary.  Additionally, you may be receiving a survey about your experience at our office within a few days to 1 week by e-mail or mail. We value your feedback.  Domingo Friend, DO [facility], [place]

## 2023-09-13 NOTE — Progress Notes (Signed)
 Subjective:    Patient ID: Jaclyn Mcknight, female    DOB: 1970-12-20, 53 y.o.   MRN: [id number]  HARLY PIPKINS is a 53 y.o. female presenting on 09/13/2023 for Nausea and Diarrhea   Virtual / Telehealth Encounter - Video Visit via MyChart The purpose of this virtual visit is to provide medical care while limiting exposure to the novel coronavirus (COVID19) for both patient and office staff.  Consent was obtained for remote visit:  Yes.   Answered questions that patient had about telehealth interaction:  Yes.   I discussed the limitations, risks, security and privacy concerns of performing an evaluation and management service by video/telephone. I also discussed with the patient that there may be a patient responsible charge related to this service. The patient expressed understanding and agreed to proceed.  Patient Location: Home Provider Location: [facility] (Office)  Participants in virtual visit: - Patient: Jaclyn Mcknight - CMA: Fuller Plan CMA - Provider: Dr Althea Charon  Patient presents for a same day appointment.  PCP Nicki Reaper, FNP   HPI  Discussed the use of AI scribe software for clinical note transcription with the patient, who gave verbal consent to proceed.  History of Present Illness   Jaclyn Mcknight is a 53 year old female who presents with nausea, abdominal cramping, and diarrhea.  She began experiencing nausea, abdominal cramping, and diarrhea this morning. Her stomach felt 'crazy' upon waking, and after attempting to drink something, her stomach pain intensified. Despite using the bathroom, the symptoms persisted.  As the day progressed, she felt increasingly fatigued and vomited about an hour before the consultation. She does not have a thermometer but feels warm, suggesting a possible fever. She has been in contact with her two grandchildren, who had RSV and experienced similar digestive symptoms after attending a  birthday party last week.  She attempted to eat at [facility] but was unable to tolerate the food, managing only small amounts of fluids. She has previously used Imodium for diarrhea. She is concerned about her ability to attend a work conference in [place] due to her symptoms and has requested a work note for absence.         08/23/2023    2:12 PM 05/25/2022    9:30 AM 02/24/2021    4:01 PM  Depression screen PHQ 2/9  Decreased Interest 0 0 0  Down, Depressed, Hopeless 0 0 0  PHQ - 2 Score 0 0 0  Altered sleeping 3  1  Tired, decreased energy 2  0  Change in appetite 0  0  Feeling bad or failure about yourself  0  1  Trouble concentrating 0  0  Moving slowly or fidgety/restless 0  0  Suicidal thoughts 0  0  PHQ-9 Score 5  2  Difficult doing work/chores Somewhat difficult  Not difficult at all       08/23/2023    2:13 PM 05/25/2022    9:30 AM 02/24/2021    4:02 PM 05/01/2020    3:25 PM  GAD 7 : Generalized Anxiety Score  Nervous, Anxious, on Edge 3 1 1  0  Control/stop worrying 1 0 0 0  Worry too much - different things 1 0 1 0  Trouble relaxing 3 1 1 1   Restless 0 0 0 0  Easily annoyed or irritable 2 0 0 0  Afraid - awful might happen 0 0 0 0  Total GAD 7 Score 10 2 3 1   Anxiety  Difficulty Somewhat difficult Not difficult at all Not difficult at all Not difficult at all    Social History   Tobacco Use   Smoking status: Never   Smokeless tobacco: Never  Vaping Use   Vaping status: Never Used  Substance Use Topics   Alcohol use: Yes    Alcohol/week: 1.0 standard drink of alcohol    Types: 1 Glasses of wine per week    Comment: 2 glasses wine per month.   Drug use: Not Currently    Types: Marijuana    Comment: OCC    Review of Systems Per HPI unless specifically indicated above     Objective:    LMP 11/15/2017 (Approximate)   Wt Readings from Last 3 Encounters:  08/23/23 178 lb 12.8 oz (81.1 kg)  07/22/22 170 lb (77.1 kg)  07/02/22 170 lb (77.1 kg)      Physical Exam  Note examination was completely remotely via video observation objective data only  Gen - tired and mild ill-appearing, no acute distress or apparent pain, comfortable HEENT - eyes appear clear without discharge or redness Heart/Lungs - cannot examine virtually - observed no evidence of coughing or labored breathing. Abd - cannot examine virtually  Skin - face visible today- no rash Neuro - awake, alert, oriented Psych - not anxious appearing   Results for orders placed or performed in visit on 08/23/23  CBC   Collection Time: 08/23/23  2:17 PM  Result Value Ref Range   WBC 5.8 3.8 - 10.8 Thousand/uL   RBC 4.40 3.80 - 5.10 Million/uL   Hemoglobin 13.3 11.7 - 15.5 g/dL   HCT 78.2 95.6 - 21.3 %   MCV 91.1 80.0 - 100.0 fL   MCH 30.2 27.0 - 33.0 pg   MCHC 33.2 32.0 - 36.0 g/dL   RDW 08.6 57.8 - 46.9 %   Platelets 287 140 - 400 Thousand/uL   MPV 9.2 7.5 - 12.5 fL  COMPLETE METABOLIC PANEL WITH GFR   Collection Time: 08/23/23  2:17 PM  Result Value Ref Range   Glucose, Bld 82 65 - 139 mg/dL   BUN 10 7 - 25 mg/dL   Creat 6.29 5.28 - 4.13 mg/dL   BUN/Creatinine Ratio SEE NOTE: 6 - 22 (calc)   Sodium 143 135 - 146 mmol/L   Potassium 4.0 3.5 - 5.3 mmol/L   Chloride 105 98 - 110 mmol/L   CO2 29 20 - 32 mmol/L   Calcium 9.2 8.6 - 10.4 mg/dL   Total Protein 6.8 6.1 - 8.1 g/dL   Albumin 4.0 3.6 - 5.1 g/dL   Globulin 2.8 1.9 - 3.7 g/dL (calc)   AG Ratio 1.4 1.0 - 2.5 (calc)   Total Bilirubin 0.4 0.2 - 1.2 mg/dL   Alkaline phosphatase (APISO) 121 37 - 153 U/L   AST 13 10 - 35 U/L   ALT 8 6 - 29 U/L  Lipid panel   Collection Time: 08/23/23  2:17 PM  Result Value Ref Range   Cholesterol 210 (H) <200 mg/dL   HDL 60 > OR = 50 mg/dL   Triglycerides 73 <244 mg/dL   LDL Cholesterol (Calc) 133 (H) mg/dL (calc)   Total CHOL/HDL Ratio 3.5 <5.0 (calc)   Non-HDL Cholesterol (Calc) 150 (H) <130 mg/dL (calc)  Hemoglobin W1U   Collection Time: 08/23/23  2:17 PM   Result Value Ref Range   Hgb A1c MFr Bld 6.3 (H) <5.7 % of total Hgb   Mean Plasma Glucose 134 mg/dL  eAG (mmol/L) 7.4 mmol/L      Assessment & Plan:   Problem List Items Addressed This Visit   None Visit Diagnoses       Viral gastroenteritis    -  Primary   Relevant Medications   ondansetron (ZOFRAN-ODT) 4 MG disintegrating tablet   dicyclomine (BENTYL) 10 MG capsule     Nausea       Relevant Medications   ondansetron (ZOFRAN-ODT) 4 MG disintegrating tablet     Abdominal cramping       Relevant Medications   dicyclomine (BENTYL) 10 MG capsule       Acute Viral Gastroenteritis Likely viral gastroenteritis due to recent exposure sick contacts from grandchildren with GI symptoms, likely self-limiting. Focus on symptom management and hydration.  - Prescribed zofran ondansetron 30 tablets, one every 8 hours as needed for nausea and vomiting. - Recommended over-the-counter loperamide for short-term diarrhea management. - Prescribed Rx dicyclomine for abdominal cramping urge and diarrhea, up to four times daily as needed. - Suggested OTC peppermint oil capsules for gastrointestinal symptoms. - Advised acetaminophen for fever and discomfort. - Encouraged rehydration with fluids like Gatorade, Pedialyte, or diluted apple juice.  Work Absence Due to Illness Unable to attend work conference due to illness. Work note required for absence. - Provided work note excusing absence from September 13, 2023, to September 15, 2023, with return on September 16, 2023.         No orders of the defined types were placed in this encounter.   Meds ordered this encounter  Medications   ondansetron (ZOFRAN-ODT) 4 MG disintegrating tablet    Sig: Take 1 tablet (4 mg total) by mouth every 8 (eight) hours as needed for nausea or vomiting.    Dispense:  30 tablet    Refill:  0   dicyclomine (BENTYL) 10 MG capsule    Sig: Take 1 capsule (10 mg total) by mouth 4 (four) times daily -  before meals and at  bedtime. As needed for abdominal cramping and diarrhea    Dispense:  30 capsule    Refill:  0    Follow up plan: Return if symptoms worsen or fail to improve.   Patient verbalizes understanding with the above medical recommendations including the limitation of remote medical advice.  Specific follow-up and call-back criteria were given for patient to follow-up or seek medical care more urgently if needed.  Total duration of direct patient care provided via video conference: 12 minutes   Domingo Friend, DO [facility] 09/13/2023, 4:28 PM

## 2024-03-02 ENCOUNTER — Encounter: Admitting: Internal Medicine

## 2024-03-13 ENCOUNTER — Encounter: Admitting: Internal Medicine

## 2024-03-13 NOTE — Progress Notes (Deleted)
 Subjective:    Patient ID: Jaclyn Mcknight, female    DOB: 06/14/1970, 53 y.o.   MRN: [id number]  HPI  Patient presents to clinic today for annual exam. Of note, her BP today is 169/105. She has been out of her Lisinopril  for 1 week.  Flu: 01/2021 Tetanus: 03/2012 COVID: Moderna x 2 Prevnar: Never Shingrix: Negative Pap smear: 04/2022 Mammogram: 10/2017 Colon screening: 07/2022 Vision screening: as needed Dentist: biannually  Diet: She does eat meat. She consumes fruits and veggies. She does eat some fried foods. She drinks mostly coffee, some water. Exercise: Walking  Review of Systems     Past Medical History:  Diagnosis Date   Anxiety    Back pain at L4-L5 level    Gravida 3 para 3    Hypertension    Migraines    MIGRAINES   Seizures (HCC)    X1 IN 2016    Current Outpatient Medications  Medication Sig Dispense Refill   dicyclomine  (BENTYL ) 10 MG capsule Take 1 capsule (10 mg total) by mouth 4 (four) times daily -  before meals and at bedtime. As needed for abdominal cramping and diarrhea 30 capsule 0   ibuprofen  (ADVIL ,MOTRIN ) 200 MG tablet Take 800 mg by mouth every 8 (eight) hours as needed (for pain.).      lisinopril -hydrochlorothiazide  (ZESTORETIC ) 20-25 MG tablet Take 1 tablet by mouth daily. 90 tablet 1   ondansetron  (ZOFRAN -ODT) 4 MG disintegrating tablet Take 1 tablet (4 mg total) by mouth every 8 (eight) hours as needed for nausea or vomiting. 30 tablet 0   SUMAtriptan  (IMITREX ) 50 MG tablet Take 0.5-1 tablets (25-50 mg total) by mouth once as needed for up to 1 dose for migraine. May repeat one dose in 2 hours if headache persists, for max dose 24 hours 12 tablet 5   venlafaxine  XR (EFFEXOR  XR) 75 MG 24 hr capsule Take 1 capsule (75 mg total) by mouth daily with breakfast. 90 capsule 1   No current facility-administered medications for this visit.    No Known Allergies  Family History  Problem Relation Age of Onset   Hypertension Mother     Diabetes Mother    Hypertension Sister    Hypertension Maternal Aunt    Diabetes Maternal Aunt    Hypertension Maternal Grandmother    Diabetes Maternal Grandmother    Colon cancer Neg Hx    Colon polyps Neg Hx    Esophageal cancer Neg Hx    Rectal cancer Neg Hx    Stomach cancer Neg Hx     Social History   Socioeconomic History   Marital status: Married    Spouse name: Not on file   Number of children: Not on file   Years of education: Not on file   Highest education level: Some college, no degree  Occupational History   Not on file  Tobacco Use   Smoking status: Never   Smokeless tobacco: Never  Vaping Use   Vaping status: Never Used  Substance and Sexual Activity   Alcohol use: Yes    Alcohol/week: 1.0 standard drink of alcohol    Types: 1 Glasses of wine per week    Comment: 2 glasses wine per month.   Drug use: Not Currently    Types: Marijuana    Comment: OCC   Sexual activity: Yes    Comment: tubal ligation  Other Topics Concern   Not on file  Social History Narrative   Not on file  Social Drivers of Corporate investment banker Strain: Low Risk  (09/13/2023)   Overall Financial Resource Strain (CARDIA)    Difficulty of Paying Living Expenses: Not hard at all  Food Insecurity: No Food Insecurity (09/13/2023)   Hunger Vital Sign    Worried About Running Out of Food in the Last Year: Never true    Ran Out of Food in the Last Year: Never true  Transportation Needs: No Transportation Needs (09/13/2023)   PRAPARE - Administrator, Civil Service (Medical): No    Lack of Transportation (Non-Medical): No  Physical Activity: Unknown (09/13/2023)   Exercise Vital Sign    Days of Exercise per Week: 0 days    Minutes of Exercise per Session: Not on file  Stress: Stress Concern Present (09/13/2023)   [employer] of Occupational Health - Occupational Stress Questionnaire    Feeling of Stress : Very much  Social Connections: Socially Integrated  (09/13/2023)   Social Connection and Isolation Panel    Frequency of Communication with Friends and Family: Three times a week    Frequency of Social Gatherings with Friends and Family: Once a week    Attends Religious Services: More than 4 times per year    Active Member of [employer] or Organizations: Yes    Attends Engineer, structural: More than 4 times per year    Marital Status: Married  Catering manager Violence: Not on file     Constitutional: Patient reports intermittent headaches.  Denies fever, malaise, fatigue, or abrupt weight changes.  HEENT: Denies eye pain, eye redness, ear pain, ringing in the ears, wax buildup, runny nose, nasal congestion, bloody nose, or sore throat. Respiratory: Denies difficulty breathing, shortness of breath, cough or sputum production.   Cardiovascular: Denies chest pain, chest tightness, palpitations or swelling in the hands or feet.  Gastrointestinal: Denies abdominal pain, bloating, constipation, diarrhea or blood in the stool.  GU: Denies urgency, frequency, pain with urination, burning sensation, blood in urine, odor or discharge. Musculoskeletal: Denies decrease in range of motion, difficulty with gait, muscle pain or joint pain and swelling.  Skin: Denies redness, rashes, lesions or ulcercations.  Neurological: Denies dizziness, difficulty with memory, difficulty with speech or problems with balance and coordination.  Psych: Patient has a history of anxiety.  Denies depression, SI/HI.  No other specific complaints in a complete review of systems (except as listed in HPI above).  Objective:   Physical Exam  LMP 11/15/2017 (Approximate)   Wt Readings from Last 3 Encounters:  08/23/23 178 lb 12.8 oz (81.1 kg)  07/22/22 170 lb (77.1 kg)  07/02/22 170 lb (77.1 kg)    General: Appears her stated age, overweight,  in NAD. Skin: Warm, dry and intact. No rashes noted. HEENT: Head: normal shape and size; Eyes: sclera white, no icterus,  conjunctiva pink, PERRLA and EOMs intact;  Neck:  Neck supple, trachea midline. No masses, lumps or thyromegaly present.  Cardiovascular: Normal rate and rhythm. S1,S2 noted.  No murmur, rubs or gallops noted. No JVD or BLE edema. No carotid bruits noted. Pulmonary/Chest: Normal effort and positive vesicular breath sounds. No respiratory distress. No wheezes, rales or ronchi noted.  Abdomen: Normal bowel sounds.  Musculoskeletal: Strength 5/5 BUE/BLE.  No difficulty with gait.  Neurological: Alert and oriented. Cranial nerves II-XII grossly intact. Coordination normal.  Psychiatric: Mood and affect normal. Behavior is normal. Judgment and thought content normal.    BMET    Component Value Date/Time   NA  143 08/23/2023 1417   K 4.0 08/23/2023 1417   CL 105 08/23/2023 1417   CO2 29 08/23/2023 1417   GLUCOSE 82 08/23/2023 1417   BUN 10 08/23/2023 1417   CREATININE 0.84 08/23/2023 1417   CALCIUM 9.2 08/23/2023 1417   GFRNONAA >60 05/30/2015 1806   GFRAA >60 05/30/2015 1806    Lipid Panel     Component Value Date/Time   CHOL 210 (H) 08/23/2023 1417   TRIG 73 08/23/2023 1417   HDL 60 08/23/2023 1417   CHOLHDL 3.5 08/23/2023 1417   LDLCALC 133 (H) 08/23/2023 1417    CBC    Component Value Date/Time   WBC 5.8 08/23/2023 1417   RBC 4.40 08/23/2023 1417   HGB 13.3 08/23/2023 1417   HCT 40.1 08/23/2023 1417   PLT 287 08/23/2023 1417   MCV 91.1 08/23/2023 1417   MCH 30.2 08/23/2023 1417   MCHC 33.2 08/23/2023 1417   RDW 12.9 08/23/2023 1417   LYMPHSABS 2.1 05/30/2015 1806   MONOABS 0.6 05/30/2015 1806   EOSABS 0.3 05/30/2015 1806   BASOSABS 0.0 05/30/2015 1806    Hgb A1C Lab Results  Component Value Date   HGBA1C 6.3 (H) 08/23/2023           Assessment & Plan:   Preventative Health Maintenance:  She declines flu shot today She declines tetanus booster Encouraged her to get her COVID-booster She declines Prevnar Discussed Shingrix vaccine, she will check  coverage with her insurance company and schedule a nurse visit if she would like to have this done Pap smear UTD Mammogram ordered-she will call to schedule Colon screening UTD Encouraged her to consume a balanced diet and exercise regimen Advised her to see an eye doctor and dentist annually We will check CBC, c-Met, lipid, A1c today  RTC in 6 months, follow-up chronic conditions Angeline Laura, NP

## 2024-06-18 ENCOUNTER — Telehealth

## 2024-06-18 DIAGNOSIS — Z20828 Contact with and (suspected) exposure to other viral communicable diseases: Secondary | ICD-10-CM

## 2024-06-18 DIAGNOSIS — J101 Influenza due to other identified influenza virus with other respiratory manifestations: Secondary | ICD-10-CM | POA: Diagnosis not present

## 2024-06-18 MED ORDER — PROMETHAZINE-DM 6.25-15 MG/5ML PO SYRP: 5.0000 mL | ORAL_SOLUTION | Freq: Four times a day (QID) | ORAL | 0 refills | Status: AC | PRN

## 2024-06-18 MED ORDER — OSELTAMIVIR PHOSPHATE 75 MG PO CAPS: 75.0000 mg | ORAL_CAPSULE | Freq: Two times a day (BID) | ORAL | 0 refills | Status: AC

## 2024-06-18 NOTE — Progress Notes (Signed)
 " Virtual Visit Consent   Jaclyn Mcknight, you are scheduled for a virtual visit with a [facility] provider today. Just as with appointments in the office, your consent must be obtained to participate. Your consent will be active for this visit and any virtual visit you may have with one of our providers in the next 365 days. If you have a MyChart account, a copy of this consent can be sent to you electronically.  As this is a virtual visit, video technology does not allow for your provider to perform a traditional examination. This may limit your provider's ability to fully assess your condition. If your provider identifies any concerns that need to be evaluated in person or the need to arrange testing (such as labs, EKG, etc.), we will make arrangements to do so. Although advances in technology are sophisticated, we cannot ensure that it will always work on either your end or our end. If the connection with a video visit is poor, the visit may have to be switched to a telephone visit. With either a video or telephone visit, we are not always able to ensure that we have a secure connection.  By engaging in this virtual visit, you consent to the provision of healthcare and authorize for your insurance to be billed (if applicable) for the services provided during this visit. Depending on your insurance coverage, you may receive a charge related to this service.  I need to obtain your verbal consent now. Are you willing to proceed with your visit today? Jaclyn Mcknight has provided verbal consent on 06/18/2024 for a virtual visit (video or telephone). Delon CHRISTELLA Dickinson, PA-C  Date: 06/18/2024 8:16 AM   Virtual Visit via Video Note   I, Delon CHRISTELLA Dickinson, connected with  Jaclyn Mcknight  ([id number], Jan 25, 1971) on 06/18/24 at  8:00 AM EST by a video-enabled telemedicine application and verified that I am speaking with the correct person using two identifiers.  Location: Patient: Virtual  Visit Location Patient: Home Provider: Virtual Visit Location Provider: Home Office   I discussed the limitations of evaluation and management by telemedicine and the availability of in person appointments. The patient expressed understanding and agreed to proceed.    History of Present Illness: Jaclyn Mcknight is a 54 y.o. who identifies as a female who was assigned female at birth, and is being seen today for flu like symptoms with flu exposure at home.  HPI: URI  This is a new problem. The current episode started yesterday (Had flu exposure last week). The problem has been gradually worsening. Maximum temperature: having chills, but does not have a thermometer. Associated symptoms include congestion, coughing (dry), headaches, a sore throat and wheezing. Pertinent negatives include no diarrhea, ear pain, nausea, plugged ear sensation, rhinorrhea, sinus pain or vomiting. Associated symptoms comments: Body aches. Treatments tried: honey lemon tea, ibuprofen , Rx cough syrup. The treatment provided no relief.    Problems:  Patient Active Problem List   Diagnosis Date Noted   Mixed hyperlipidemia 08/23/2023   Prediabetes 08/23/2023   Overweight with body mass index (BMI) of 28 to 28.9 in adult 02/25/2021   Anxiety 05/01/2020   Essential (primary) hypertension 09/19/2014   Migraine without aura and responsive to treatment 09/19/2014    Allergies: Allergies[1] Medications: Current Medications[2]  Observations/Objective: Patient is well-developed, well-nourished in no acute distress.  Resting comfortably at home.  Head is normocephalic, atraumatic.  No labored breathing.  Speech is clear and coherent with logical content.  Patient is alert and oriented at baseline.    Assessment and Plan: 1. Influenza B (Primary) - oseltamivir  (TAMIFLU ) 75 MG capsule; Take 1 capsule (75 mg total) by mouth 2 (two) times daily.  Dispense: 10 capsule; Refill: 0 - promethazine -dextromethorphan  (PROMETHAZINE -DM) 6.25-15 MG/5ML syrup; Take 5 mLs by mouth 4 (four) times daily as needed.  Dispense: 118 mL; Refill: 0  2. Exposure to the flu - oseltamivir  (TAMIFLU ) 75 MG capsule; Take 1 capsule (75 mg total) by mouth 2 (two) times daily.  Dispense: 10 capsule; Refill: 0 - promethazine -dextromethorphan (PROMETHAZINE -DM) 6.25-15 MG/5ML syrup; Take 5 mLs by mouth 4 (four) times daily as needed.  Dispense: 118 mL; Refill: 0  - Suspect influenza due to symptoms and positive exposure - Tamiflu  prescribed - Promethazine  DM for cough - Continue OTC medication of choice for symptomatic management - Push fluids - Rest - Work note provided - Seek in person evaluation if symptoms worsen or fail to improve   Follow Up Instructions: I discussed the assessment and treatment plan with the patient. The patient was provided an opportunity to ask questions and all were answered. The patient agreed with the plan and demonstrated an understanding of the instructions.  A copy of instructions were sent to the patient via MyChart unless otherwise noted below.    The patient was advised to call back or seek an in-person evaluation if the symptoms worsen or if the condition fails to improve as anticipated.    Delon HERO Leighla Chestnutt, PA-C     [1] No Known Allergies [2]  Current Outpatient Medications:    oseltamivir  (TAMIFLU ) 75 MG capsule, Take 1 capsule (75 mg total) by mouth 2 (two) times daily., Disp: 10 capsule, Rfl: 0   promethazine -dextromethorphan (PROMETHAZINE -DM) 6.25-15 MG/5ML syrup, Take 5 mLs by mouth 4 (four) times daily as needed., Disp: 118 mL, Rfl: 0   dicyclomine  (BENTYL ) 10 MG capsule, Take 1 capsule (10 mg total) by mouth 4 (four) times daily -  before meals and at bedtime. As needed for abdominal cramping and diarrhea, Disp: 30 capsule, Rfl: 0   ibuprofen  (ADVIL ,MOTRIN ) 200 MG tablet, Take 800 mg by mouth every 8 (eight) hours as needed (for pain.). , Disp: , Rfl:     lisinopril -hydrochlorothiazide  (ZESTORETIC ) 20-25 MG tablet, Take 1 tablet by mouth daily., Disp: 90 tablet, Rfl: 1   ondansetron  (ZOFRAN -ODT) 4 MG disintegrating tablet, Take 1 tablet (4 mg total) by mouth every 8 (eight) hours as needed for nausea or vomiting., Disp: 30 tablet, Rfl: 0   SUMAtriptan  (IMITREX ) 50 MG tablet, Take 0.5-1 tablets (25-50 mg total) by mouth once as needed for up to 1 dose for migraine. May repeat one dose in 2 hours if headache persists, for max dose 24 hours, Disp: 12 tablet, Rfl: 5   venlafaxine  XR (EFFEXOR  XR) 75 MG 24 hr capsule, Take 1 capsule (75 mg total) by mouth daily with breakfast., Disp: 90 capsule, Rfl: 1  "

## 2024-06-18 NOTE — Patient Instructions (Signed)
 " Jaclyn Mcknight, thank you for joining Delon CHRISTELLA Dickinson, PA-C for today's virtual visit.  While this provider is not your primary care provider (PCP), if your PCP is located in our provider database this encounter information will be shared with them immediately following your visit.   A [facility] MyChart account gives you access to today's visit and all your visits, tests, and labs performed at [facility]  click here if you don't have a [facility] MyChart account or go to [email or web address]  Consent: (Patient) Jaclyn Mcknight provided verbal consent for this virtual visit at the beginning of the encounter.  Current Medications:  Current Outpatient Medications:    oseltamivir  (TAMIFLU ) 75 MG capsule, Take 1 capsule (75 mg total) by mouth 2 (two) times daily., Disp: 10 capsule, Rfl: 0   promethazine -dextromethorphan (PROMETHAZINE -DM) 6.25-15 MG/5ML syrup, Take 5 mLs by mouth 4 (four) times daily as needed., Disp: 118 mL, Rfl: 0   dicyclomine  (BENTYL ) 10 MG capsule, Take 1 capsule (10 mg total) by mouth 4 (four) times daily -  before meals and at bedtime. As needed for abdominal cramping and diarrhea, Disp: 30 capsule, Rfl: 0   ibuprofen  (ADVIL ,MOTRIN ) 200 MG tablet, Take 800 mg by mouth every 8 (eight) hours as needed (for pain.). , Disp: , Rfl:    lisinopril -hydrochlorothiazide  (ZESTORETIC ) 20-25 MG tablet, Take 1 tablet by mouth daily., Disp: 90 tablet, Rfl: 1   ondansetron  (ZOFRAN -ODT) 4 MG disintegrating tablet, Take 1 tablet (4 mg total) by mouth every 8 (eight) hours as needed for nausea or vomiting., Disp: 30 tablet, Rfl: 0   SUMAtriptan  (IMITREX ) 50 MG tablet, Take 0.5-1 tablets (25-50 mg total) by mouth once as needed for up to 1 dose for migraine. May repeat one dose in 2 hours if headache persists, for max dose 24 hours, Disp: 12 tablet, Rfl: 5   venlafaxine  XR (EFFEXOR  XR) 75 MG 24 hr capsule, Take 1 capsule (75 mg total) by mouth daily with  breakfast., Disp: 90 capsule, Rfl: 1   Medications ordered in this encounter:  Meds ordered this encounter  Medications   oseltamivir  (TAMIFLU ) 75 MG capsule    Sig: Take 1 capsule (75 mg total) by mouth 2 (two) times daily.    Dispense:  10 capsule    Refill:  0    Supervising Provider:   LAMPTEY, PHILIP O [[id number]]   promethazine -dextromethorphan (PROMETHAZINE -DM) 6.25-15 MG/5ML syrup    Sig: Take 5 mLs by mouth 4 (four) times daily as needed.    Dispense:  118 mL    Refill:  0    Supervising Provider:   BLAISE ALEENE KIDD [[id number]]     *If you need refills on other medications prior to your next appointment, please contact your pharmacy*  Follow-Up: Call back or seek an in-person evaluation if the symptoms worsen or if the condition fails to improve as anticipated.  [facility] [phone]  Other Instructions Influenza, Adult Influenza is also called the flu. It's an infection that affects your respiratory tract. This includes your nose, throat, windpipe, and lungs. The flu is contagious. This means it spreads easily from person to person. It causes symptoms that are like a cold. It can also cause a high fever and body aches. What are the causes? The flu is caused by the influenza virus. You can get it by: Breathing in droplets that are in the air after an infected person coughs or sneezes. Touching something that has the virus on  it and then touching your mouth, nose, or eyes. What increases the risk? You may be more likely to get the flu if: You don't wash your hands often. You're near a lot of people during cold and flu season. You touch your mouth, eyes, or nose without washing your hands first. You don't get a flu shot each year. You may also be more at risk for the flu and serious problems, such as a lung infection called pneumonia, if: You're older than 65. You're pregnant. Your immune system is weak. Your immune system is your body's defense  system. You have a long-term, or chronic, condition, such as: Heart, kidney, or lung disease. Diabetes. A liver disorder. Asthma. You're very overweight. You have anemia. This is when you don't have enough red blood cells in your body. What are the signs or symptoms? Flu symptoms often start all of a sudden. They may last 4-14 days and include: Fever and chills. Headaches, body aches, or muscle aches. Sore throat. Cough. Runny or stuffy nose. Discomfort in your chest. Not wanting to eat as much as normal. Feeling weak or tired. Feeling dizzy. Nausea or vomiting. How is this diagnosed? The flu may be diagnosed based on your symptoms and medical history. You may also have a physical exam. A swab may be taken from your nose or throat and tested for the virus. How is this treated? If the flu is found early, you can be treated with antiviral medicine. This may be given to you by mouth or through an IV. It can help you feel less sick and get better faster. Taking care of yourself at home can also help your symptoms get better. Your health care provider may tell you to: Take over-the-counter medicines. Drink lots of fluids. The flu often goes away on its own. If you have very bad symptoms or problems caused by the flu, you may need to be treated in a hospital. Follow these instructions at home: Activity Rest as needed. Get lots of sleep. Stay home from work or school as told by your provider. Leave home only to go see your provider. Do not leave home for other reasons until you don't have a fever for 24 hours without taking medicine. Eating and drinking Take an oral rehydration solution (ORS). This is a drink that is sold at pharmacies and stores. Drink enough fluid to keep your pee pale yellow. Try to drink small amounts of clear fluids. These include water, ice chips, fruit juice mixed with water, and low-calorie sports drinks. Try to eat bland foods that are easy to digest. These  include bananas, applesauce, rice, lean meats, toast, and crackers. Avoid drinks that have a lot of sugar or caffeine  in them. These include energy drinks, regular sports drinks, and soda. Do not drink alcohol. Do not eat spicy or fatty foods. General instructions     Take your medicines only as told by your provider. Use a cool mist humidifier to add moisture to the air in your home. This can make it easier for you to breathe. You should also clean the humidifier every day. To do so: Empty the water. Pour clean water in. Cover your mouth and nose when you cough or sneeze. Wash your hands with soap and water often and for at least 20 seconds. It's extra important to do so after you cough or sneeze. If you can't use soap and water, use hand sanitizer. How is this prevented?  Get a flu shot every year. Ask  your provider when you should get your flu shot. Stay away from people who are sick during fall and winter. Fall and winter are cold and flu season. Contact a health care provider if: You get new symptoms. You have chest pain. You have watery poop, also called diarrhea. You have a fever. Your cough gets worse. You start to have more mucus. You feel like you may vomit, or you vomit. Get help right away if: You become short of breath or have trouble breathing. Your skin or nails turn blue. You have very bad pain or stiffness in your neck. You get a sudden headache or pain in your face or ear. You vomit each time you eat or drink. These symptoms may be an emergency. Call 911 right away. Do not wait to see if the symptoms will go away. Do not drive yourself to the hospital. This information is not intended to replace advice given to you by your health care provider. Make sure you discuss any questions you have with your health care provider. Document Revised: 02/17/2023 Document Reviewed: 06/24/2022 Elsevier Patient Education  2024 [employer]   If you have been instructed to  have an in-person evaluation today at a local Urgent Care facility, please use the link below. It will take you to a list of all of our available [facility], including address, phone number and hours of operation. Please do not delay care.  [facility]  If you or a family member do not have a primary care provider, use the link below to schedule a visit and establish care. When you choose a [facility] primary care physician or advanced practice provider, you gain a long-term partner in health. Find a Primary Care Provider  Learn more about [facility]'s in-office and virtual care options: [facility] - Get Care Now "
